# Patient Record
Sex: Female | Born: 1975 | Race: White | Hispanic: No | Marital: Married | Smoking: Former smoker
Health system: Southern US, Community
[De-identification: ages and names within clinical notes are randomized; demographics above are authoritative.]

## PROBLEM LIST (undated history)

## (undated) DIAGNOSIS — E079 Disorder of thyroid, unspecified: Secondary | ICD-10-CM

## (undated) DIAGNOSIS — N870 Mild cervical dysplasia: Secondary | ICD-10-CM

## (undated) DIAGNOSIS — F410 Panic disorder [episodic paroxysmal anxiety] without agoraphobia: Secondary | ICD-10-CM

## (undated) HISTORY — PX: WRIST TENODESIS: SHX1094

## (undated) HISTORY — PX: COLPOSCOPY: SHX161

## (undated) HISTORY — PX: BREAST SURGERY: SHX581

## (undated) HISTORY — PX: CHEST TUBE INSERTION: SHX231

## (undated) HISTORY — PX: GYNECOLOGIC CRYOSURGERY: SHX857

## (undated) HISTORY — PX: OTHER SURGICAL HISTORY: SHX169

## (undated) HISTORY — DX: Mild cervical dysplasia: N87.0

## (undated) HISTORY — DX: Disorder of thyroid, unspecified: E07.9

---

## 1999-04-08 ENCOUNTER — Ambulatory Visit (HOSPITAL_COMMUNITY): Admission: RE | Admit: 1999-04-08 | Discharge: 1999-04-08 | Payer: Self-pay | Admitting: Neurosurgery

## 1999-04-08 ENCOUNTER — Encounter: Payer: Self-pay | Admitting: Neurosurgery

## 2000-12-14 ENCOUNTER — Other Ambulatory Visit: Admission: RE | Admit: 2000-12-14 | Discharge: 2000-12-14 | Payer: Self-pay | Admitting: Plastic Surgery

## 2000-12-14 ENCOUNTER — Encounter (INDEPENDENT_AMBULATORY_CARE_PROVIDER_SITE_OTHER): Payer: Self-pay

## 2001-06-08 ENCOUNTER — Other Ambulatory Visit: Admission: RE | Admit: 2001-06-08 | Discharge: 2001-06-08 | Payer: Self-pay | Admitting: *Deleted

## 2003-08-10 ENCOUNTER — Other Ambulatory Visit: Admission: RE | Admit: 2003-08-10 | Discharge: 2003-08-10 | Payer: Self-pay | Admitting: *Deleted

## 2007-11-11 ENCOUNTER — Other Ambulatory Visit: Admission: RE | Admit: 2007-11-11 | Discharge: 2007-11-11 | Payer: Self-pay | Admitting: Obstetrics and Gynecology

## 2008-12-06 ENCOUNTER — Encounter: Payer: Self-pay | Admitting: Obstetrics and Gynecology

## 2008-12-06 ENCOUNTER — Other Ambulatory Visit: Admission: RE | Admit: 2008-12-06 | Discharge: 2008-12-06 | Payer: Self-pay | Admitting: Obstetrics and Gynecology

## 2008-12-06 ENCOUNTER — Ambulatory Visit: Payer: Self-pay | Admitting: Obstetrics and Gynecology

## 2009-07-19 ENCOUNTER — Emergency Department (HOSPITAL_COMMUNITY): Admission: EM | Admit: 2009-07-19 | Discharge: 2009-07-19 | Payer: Self-pay | Admitting: Emergency Medicine

## 2009-11-19 ENCOUNTER — Encounter: Admission: RE | Admit: 2009-11-19 | Discharge: 2009-11-19 | Payer: Self-pay | Admitting: Family Medicine

## 2009-12-10 ENCOUNTER — Other Ambulatory Visit: Admission: RE | Admit: 2009-12-10 | Discharge: 2009-12-10 | Payer: Self-pay | Admitting: Obstetrics and Gynecology

## 2009-12-10 ENCOUNTER — Ambulatory Visit: Payer: Self-pay | Admitting: Obstetrics and Gynecology

## 2009-12-13 ENCOUNTER — Ambulatory Visit: Payer: Self-pay | Admitting: Obstetrics and Gynecology

## 2010-01-25 ENCOUNTER — Ambulatory Visit: Payer: Self-pay | Admitting: Obstetrics and Gynecology

## 2011-11-26 ENCOUNTER — Encounter: Payer: Self-pay | Admitting: Gynecology

## 2011-11-26 DIAGNOSIS — N870 Mild cervical dysplasia: Secondary | ICD-10-CM | POA: Insufficient documentation

## 2011-11-27 ENCOUNTER — Other Ambulatory Visit (HOSPITAL_COMMUNITY)
Admission: RE | Admit: 2011-11-27 | Discharge: 2011-11-27 | Disposition: A | Discharge status: Home / Self Care | Payer: BC Managed Care – PPO | Source: Ambulatory Visit | Attending: Obstetrics and Gynecology | Admitting: Obstetrics and Gynecology

## 2011-11-27 ENCOUNTER — Ambulatory Visit (INDEPENDENT_AMBULATORY_CARE_PROVIDER_SITE_OTHER): Payer: BC Managed Care – PPO | Admitting: Obstetrics and Gynecology

## 2011-11-27 ENCOUNTER — Encounter: Payer: Self-pay | Admitting: Obstetrics and Gynecology

## 2011-11-27 VITALS — BP 114/70 | Ht 61.0 in | Wt 114.0 lb

## 2011-11-27 DIAGNOSIS — Z01419 Encounter for gynecological examination (general) (routine) without abnormal findings: Secondary | ICD-10-CM

## 2011-11-27 DIAGNOSIS — R350 Frequency of micturition: Secondary | ICD-10-CM

## 2011-11-27 MED ORDER — NITROFURANTOIN MONOHYD MACRO 100 MG PO CAPS: 100.0000 mg | ORAL_CAPSULE | Freq: Two times a day (BID) | ORAL | Status: AC

## 2011-11-27 NOTE — Progress Notes (Signed)
Addended byCammie Mcgee T on: 11/27/2011 03:19 PM   Modules accepted: Orders

## 2011-11-27 NOTE — Progress Notes (Signed)
Patient came to see me today for her annual GYN exam. She continues with a Mirena IUD which she is very happy with. She is amenorrheic. She's noticed for 2 weeks some urinary frequency urgency and dysuria. Urinalysis today was normal. Patient is having no pelvic pain. Patient is done lab work through PCP.  Physical examination:  Kennon Portela present HEENT within normal limits. Neck: Thyroid not large. No masses. Supraclavicular nodes: not enlarged. Breasts: Examined in both sitting midline position. No skin changes and no masses. Abdomen: Soft no guarding rebound or masses or hernia. Pelvic: External: Within normal limits. BUS: Within normal limits. Vaginal:within normal limits. Good estrogen effect. No evidence of cystocele rectocele or enterocele. Cervix: clean, IUD string visible.Uterus: Normal size and shape. Adnexa: No masses. Rectovaginal exam: Confirmatory and negative. Extremities: Within normal limits.  Assessment: #1. Normal GYN exam #2. Probable UTI in spite of negative UA  Plan: Macrobid twice a day with food for 5 days. Urine culture.

## 2013-02-02 ENCOUNTER — Emergency Department (HOSPITAL_COMMUNITY): Payer: BC Managed Care – PPO

## 2013-02-02 ENCOUNTER — Emergency Department (HOSPITAL_COMMUNITY)
Admission: EM | Admit: 2013-02-02 | Discharge: 2013-02-02 | Disposition: A | Discharge status: Home / Self Care | Payer: BC Managed Care – PPO | Attending: Emergency Medicine | Admitting: Emergency Medicine

## 2013-02-02 ENCOUNTER — Encounter (HOSPITAL_COMMUNITY): Payer: Self-pay | Admitting: Emergency Medicine

## 2013-02-02 DIAGNOSIS — N83209 Unspecified ovarian cyst, unspecified side: Secondary | ICD-10-CM | POA: Insufficient documentation

## 2013-02-02 DIAGNOSIS — F410 Panic disorder [episodic paroxysmal anxiety] without agoraphobia: Secondary | ICD-10-CM | POA: Insufficient documentation

## 2013-02-02 DIAGNOSIS — N83201 Unspecified ovarian cyst, right side: Secondary | ICD-10-CM

## 2013-02-02 DIAGNOSIS — R197 Diarrhea, unspecified: Secondary | ICD-10-CM | POA: Insufficient documentation

## 2013-02-02 DIAGNOSIS — K219 Gastro-esophageal reflux disease without esophagitis: Secondary | ICD-10-CM | POA: Insufficient documentation

## 2013-02-02 DIAGNOSIS — Z3202 Encounter for pregnancy test, result negative: Secondary | ICD-10-CM | POA: Insufficient documentation

## 2013-02-02 DIAGNOSIS — Z975 Presence of (intrauterine) contraceptive device: Secondary | ICD-10-CM | POA: Insufficient documentation

## 2013-02-02 DIAGNOSIS — F329 Major depressive disorder, single episode, unspecified: Secondary | ICD-10-CM | POA: Insufficient documentation

## 2013-02-02 DIAGNOSIS — Z87410 Personal history of cervical dysplasia: Secondary | ICD-10-CM | POA: Insufficient documentation

## 2013-02-02 DIAGNOSIS — F411 Generalized anxiety disorder: Secondary | ICD-10-CM | POA: Insufficient documentation

## 2013-02-02 DIAGNOSIS — F3289 Other specified depressive episodes: Secondary | ICD-10-CM | POA: Insufficient documentation

## 2013-02-02 DIAGNOSIS — R110 Nausea: Secondary | ICD-10-CM | POA: Insufficient documentation

## 2013-02-02 DIAGNOSIS — Z79899 Other long term (current) drug therapy: Secondary | ICD-10-CM | POA: Insufficient documentation

## 2013-02-02 HISTORY — DX: Panic disorder (episodic paroxysmal anxiety): F41.0

## 2013-02-02 LAB — POCT I-STAT, CHEM 8
Calcium, Ion: 1.16 mmol/L (ref 1.12–1.23)
Chloride: 105 mEq/L (ref 96–112)
Creatinine, Ser: 0.8 mg/dL (ref 0.50–1.10)
Glucose, Bld: 98 mg/dL (ref 70–99)
HCT: 41 % (ref 36.0–46.0)
Hemoglobin: 13.9 g/dL (ref 12.0–15.0)
Potassium: 3.6 mEq/L (ref 3.5–5.1)

## 2013-02-02 LAB — CBC WITH DIFFERENTIAL/PLATELET
Basophils Absolute: 0 10*3/uL (ref 0.0–0.1)
Basophils Relative: 0 % (ref 0–1)
Eosinophils Relative: 1 % (ref 0–5)
HCT: 38 % (ref 36.0–46.0)
Hemoglobin: 13.3 g/dL (ref 12.0–15.0)
MCH: 31.4 pg (ref 26.0–34.0)
MCHC: 35 g/dL (ref 30.0–36.0)
MCV: 89.6 fL (ref 78.0–100.0)
Monocytes Absolute: 0.8 10*3/uL (ref 0.1–1.0)
Monocytes Relative: 11 % (ref 3–12)
RDW: 12.7 % (ref 11.5–15.5)

## 2013-02-02 LAB — URINALYSIS, ROUTINE W REFLEX MICROSCOPIC
Bilirubin Urine: NEGATIVE
Hgb urine dipstick: NEGATIVE
Ketones, ur: NEGATIVE mg/dL
Nitrite: NEGATIVE
Urobilinogen, UA: 0.2 mg/dL (ref 0.0–1.0)

## 2013-02-02 LAB — WET PREP, GENITAL: Trich, Wet Prep: NONE SEEN

## 2013-02-02 MED ORDER — ONDANSETRON 4 MG PO TBDP: 4.0000 mg | ORAL_TABLET | Freq: Four times a day (QID) | ORAL | Status: DC | PRN

## 2013-02-02 MED ORDER — HYDROMORPHONE HCL PF 1 MG/ML IJ SOLN
1.0000 mg | Freq: Once | INTRAMUSCULAR | Status: AC
  Administered 2013-02-02: 1 mg via INTRAVENOUS
  Filled 2013-02-02: qty 1

## 2013-02-02 MED ORDER — ONDANSETRON HCL 4 MG/2ML IJ SOLN
4.0000 mg | INTRAMUSCULAR | Status: AC
  Administered 2013-02-02: 4 mg via INTRAVENOUS
  Filled 2013-02-02: qty 2

## 2013-02-02 MED ORDER — IOHEXOL 300 MG/ML  SOLN
100.0000 mL | Freq: Once | INTRAMUSCULAR | Status: AC | PRN
  Administered 2013-02-02: 80 mL via INTRAVENOUS

## 2013-02-02 MED ORDER — IOHEXOL 300 MG/ML  SOLN: 25.0000 mL | Freq: Once | INTRAMUSCULAR | Status: DC | PRN

## 2013-02-02 MED ORDER — PERCOCET 5-325 MG PO TABS: 1.0000 | ORAL_TABLET | Freq: Four times a day (QID) | ORAL | Status: DC | PRN

## 2013-02-02 MED ORDER — SODIUM CHLORIDE 0.9 % IV BOLUS (SEPSIS)
1000.0000 mL | Freq: Once | INTRAVENOUS | Status: AC
  Administered 2013-02-02: 1000 mL via INTRAVENOUS

## 2013-02-02 NOTE — ED Provider Notes (Signed)
History     CSN: [id number]  Arrival date & time 02/02/13  1731   First MD Initiated Contact with Patient 02/02/13 1736      Chief Complaint  Patient presents with  . Abdominal Pain  . Diarrhea  . Nausea    (Consider location/radiation/quality/duration/timing/severity/associated sxs/prior treatment) HPI Comments: Patient with PMH of GERD, per patient, presents for abdominal pain x 1 wk that has been gradually worsening over the last two days. Pain is right sided, constant, with intermittent episodes of sharp/stabbing pain. Pain improves when she hold tightly on her stomach. Admits to associated nausea and diarrhea x 2 days. She has had 5 episodes of diarrhea today that have been non-bloody and watery. Has tried taking rolaids for the pain without relief. Last meal was some soup at noon today which she kept down but increased her abdominal discomfort; not associated with fatty meals. Denies fever, lightheadedness, dizziness, CP, SOB, vomiting, dysuria, hematuria, and vaginal discharge. Patient denies recent travel and new medications. Unsure about timing of menstrual cycle; on Mirena which was put in place 12/13/09. Pt is a regular runner for exercise.  Patient is a 37 y.o. female presenting with abdominal pain and diarrhea. The history is provided by the patient. No language interpreter was used.  Abdominal Pain The primary symptoms of the illness include abdominal pain, nausea and diarrhea. The primary symptoms of the illness do not include fever, shortness of breath, vomiting, dysuria, vaginal discharge or vaginal bleeding.  Symptoms associated with the illness do not include chills or hematuria.  Diarrhea The primary symptoms include abdominal pain, nausea and diarrhea. Primary symptoms do not include fever, vomiting, dysuria or rash.  The illness does not include chills.    Past Medical History  Diagnosis Date  . CIN I (cervical intraepithelial neoplasia I)   . Depression   .  Anxiety   . Panic attacks     Past Surgical History  Procedure Date  . Gynecologic cryosurgery   . Breast surgery     Reduction  . Chest tube insertion   . Mirena     Inserted 12-13-09  . Colposcopy   . Wrist tenodesis     Family History  Problem Relation Age of Onset  . Breast cancer Mother   . Colon cancer Maternal Grandmother   . Colon cancer Maternal Grandfather   . Lung cancer Paternal Grandmother   . Prostate cancer Paternal Grandfather     History  Substance Use Topics  . Smoking status: Never Smoker   . Smokeless tobacco: Not on file  . Alcohol Use: Yes     Comment: rare    OB History    Grav Para Term Preterm Abortions TAB SAB Ect Mult Living   0               Review of Systems  Constitutional: Negative for fever and chills.  HENT: Negative for sore throat and trouble swallowing.   Respiratory: Negative for chest tightness and shortness of breath.   Cardiovascular: Negative for chest pain.  Gastrointestinal: Positive for nausea, abdominal pain and diarrhea. Negative for vomiting.  Genitourinary: Negative for dysuria, hematuria, vaginal bleeding, vaginal discharge and difficulty urinating.  Skin: Negative for rash.  Neurological: Negative for dizziness, syncope, light-headedness and headaches.    Allergies  Review of patient's allergies indicates no known allergies.  Home Medications   Current Outpatient Rx  Name  Route  Sig  Dispense  Refill  . ALPRAZOLAM 0.5 MG PO TABS  Oral   Take 0.5 mg by mouth as needed. anxiety         . B COMPLEX PO   Oral   Take by mouth.           Marland Kitchen DIHYDROXYALUMINUM SOD CARB 334 MG PO CHEW   Oral   Chew 1 tablet by mouth as needed. Heartburn         . CALCIUM + D PO   Oral   Take by mouth.           Marland Kitchen VITAMIN D PO   Oral   Take 2,000 Units by mouth.           . ONE-DAILY MULTI VITAMINS PO TABS   Oral   Take 1 tablet by mouth daily.           . SERTRALINE HCL 100 MG PO TABS   Oral   Take  200 mg by mouth daily.          Marland Kitchen VITAMIN B-6 25 MG PO TABS   Oral   Take 25 mg by mouth daily.           BP 117/67  Pulse 99  Temp 97.9 F (36.6 C) (Oral)  Resp 15  SpO2 99%  Physical Exam  Constitutional: She is oriented to person, place, and time.  HENT:  Head: Normocephalic and atraumatic.  Eyes: Conjunctivae normal are normal. Pupils are equal, round, and reactive to light. No scleral icterus.  Neck: Normal range of motion.  Cardiovascular: Normal rate, regular rhythm and normal heart sounds.   No murmur heard. Pulmonary/Chest: Effort normal and breath sounds normal. No respiratory distress. She has no wheezes.  Abdominal: Soft. Bowel sounds are normal. She exhibits no mass. There is tenderness in the right lower quadrant and periumbilical area. There is tenderness at McBurney's point. There is no rigidity and no guarding.  Genitourinary: Vagina normal. There is no rash, tenderness or lesion on the right labia. There is no rash, tenderness or lesion on the left labia. Uterus is not enlarged and not tender. Cervix exhibits no motion tenderness and no discharge. Right adnexum displays no mass and no tenderness. Left adnexum displays no mass and no tenderness. No erythema, tenderness or bleeding around the vagina. No vaginal discharge found.  Musculoskeletal: Normal range of motion.  Neurological: She is alert and oriented to person, place, and time.  Skin: Skin is warm. No rash noted. No erythema. No pallor.  Psychiatric: She has a normal mood and affect. Her behavior is normal.    ED Course  Procedures (including critical care time)  Labs Reviewed  WET PREP, GENITAL - Abnormal; Notable for the following:    Clue Cells Wet Prep HPF POC RARE (*)     WBC, Wet Prep HPF POC FEW (*)     All other components within normal limits  CBC WITH DIFFERENTIAL  URINALYSIS, ROUTINE W REFLEX MICROSCOPIC  POCT I-STAT, CHEM 8  PREGNANCY, URINE  GC/CHLAMYDIA PROBE AMP   No results  found.   No diagnosis found.    MDM  Patient presents for abdominal pain x 1 week that has been progressively worse over the last two days. Associated with nausea, without emesis, and watery non-bloody diarrhea. Patient is tender on palpation primarily in RLQ; some tenderness in RUQ and right flank/lower back. Patient given zofran and dilaudid to help with pain and nausea which she states has significantly improved her symptoms. Patient given IVF as well. Work up  includes UA, chem8, and CBC. Pelvic exam completed to assess for possible ovarian cyst and PID which was benign. Have continued work up with CT abdomen/pelvis with contrast to try and determine etiology of abdominal pain. Will monitor.  Patient signed out to [facility], PA-C at shift change.        Antony Madura, PA-C 02/02/13 2014

## 2013-02-02 NOTE — ED Notes (Signed)
Per EMS pt been having lower abd pain for one week with diarrhea and nausea.  Pt had appt for tomorrow to see PCP about pain but pt was on her way home from work today and had severe pain and experienced a panic attack so called EMS.  18g left AC.

## 2013-02-02 NOTE — ED Provider Notes (Signed)
Patient care resumed from previous provider Antony Madura and Earley Favor.  Patient presented with Abd pain x1 wk grad worsening. RLQ primarily w nausea. Benign pelvic exam. CT abd pending. Labs WNL. IVF, pain and antiemetics given. If normal PCP follow up scheduled tmw here for pain mngt.   CT ABDOMEN AND PELVIS WITH CONTRAST  Technique: Multidetector CT imaging of the abdomen and pelvis was performed following the standard protocol during bolus administration of intravenous contrast. Additional delayed images were also obtained through the pelvis to allow further oral contrast opacification of distal small bowel and cecum.  Contrast: 80mL OMNIPAQUE IOHEXOL 300 MG/ML SOLN  Comparison: None.  Findings: The abdominal parenchymal organs are normal in appearance. Gallbladder is unremarkable. No evidence of hydronephrosis. No soft tissue masses or lymphadenopathy identified within the abdomen or pelvis.  Appendix is not well visualized, but no inflammatory process is seen in the area the cecum were elsewhere within the abdomen or pelvis. An IUD is seen in appropriate position. A small corpus luteum is seen in the right ovary measuring 1.6 cm. No inflammatory process or abnormal fluid collections are identified. No evidence of bowel wall thickening, dilatation, or hernia.  IMPRESSION:  1. 1.6 cm right ovarian corpus luteum cyst. 2. IUD in expected location. 3. No other significant abnormality identified.  10:26 PM  On reevaluation nonsurgical abdomen and patient states she is currently pain-free.  CT results below showing 1.6 cm right ovarian cyst which was discussed with patient.  She is to follow up with her OB/GYN for further evaluation.  Will discharge with pain medication and strict return percussions discussed.  At this time there does not appear to be any evidence of an acute emergency medical condition and the patient appears stable for discharge with appropriate outpatient follow  up. Diagnosis was discussed with patient who verbalizes understanding and is agreeable to discharge.   Jaci Carrel, [place] 02/02/13 2228

## 2013-02-02 NOTE — ED Notes (Signed)
NP at bedside--- pelvic exam ongoing.

## 2013-02-02 NOTE — ED Notes (Signed)
[id number]<IO> Expected date:<BR> Expected time:<BR> Means of arrival:<BR> Comments:<BR> 37 y/o abd pain

## 2013-02-03 LAB — GC/CHLAMYDIA PROBE AMP
CT Probe RNA: NEGATIVE
GC Probe RNA: NEGATIVE

## 2013-02-04 ENCOUNTER — Encounter: Payer: Self-pay | Admitting: Gynecology

## 2013-02-04 ENCOUNTER — Ambulatory Visit (INDEPENDENT_AMBULATORY_CARE_PROVIDER_SITE_OTHER): Payer: BC Managed Care – PPO

## 2013-02-04 ENCOUNTER — Ambulatory Visit (INDEPENDENT_AMBULATORY_CARE_PROVIDER_SITE_OTHER): Payer: BC Managed Care – PPO | Admitting: Gynecology

## 2013-02-04 VITALS — BP 120/78 | Ht 61.0 in | Wt 110.0 lb

## 2013-02-04 DIAGNOSIS — R102 Pelvic and perineal pain: Secondary | ICD-10-CM

## 2013-02-04 DIAGNOSIS — N949 Unspecified condition associated with female genital organs and menstrual cycle: Secondary | ICD-10-CM

## 2013-02-04 DIAGNOSIS — N83201 Unspecified ovarian cyst, right side: Secondary | ICD-10-CM

## 2013-02-04 DIAGNOSIS — Z01419 Encounter for gynecological examination (general) (routine) without abnormal findings: Secondary | ICD-10-CM

## 2013-02-04 DIAGNOSIS — N83209 Unspecified ovarian cyst, unspecified side: Secondary | ICD-10-CM

## 2013-02-04 LAB — CBC WITH DIFFERENTIAL/PLATELET
Basophils Absolute: 0 10*3/uL (ref 0.0–0.1)
Basophils Relative: 0 % (ref 0–1)
HCT: 38.5 % (ref 36.0–46.0)
Lymphocytes Relative: 33 % (ref 12–46)
MCHC: 34.3 g/dL (ref 30.0–36.0)
Neutro Abs: 4.7 10*3/uL (ref 1.7–7.7)
Neutrophils Relative %: 59 % (ref 43–77)
RDW: 14.1 % (ref 11.5–15.5)
WBC: 8 10*3/uL (ref 4.0–10.5)

## 2013-02-04 MED ORDER — IBUPROFEN 800 MG PO TABS: 800.0000 mg | ORAL_TABLET | Freq: Three times a day (TID) | ORAL | Status: DC | PRN

## 2013-02-04 NOTE — Progress Notes (Signed)
Monica Lawson 09/21/1976 [id number]   History:    37 y.o.  for annual gyn exam who was seen in the emergency room this past Wednesday complaining of right lower quadrant pain feeling bloated and full which started a few days prior to that. Patient has a Mirena IUD for contraception that was placed in 2010 and she rarely has a menstrual cycle. She denies any recent dyspareunia and is in a monogamous relationship. She denies any GU or GI complaints. She had a CT scan in the emergency room with the following result:  Findings: The abdominal parenchymal organs are normal in  appearance. Gallbladder is unremarkable. No evidence of  hydronephrosis. No soft tissue masses or lymphadenopathy  identified within the abdomen or pelvis.  Appendix is not well visualized, but no inflammatory process is  seen in the area the cecum were elsewhere within the abdomen or  pelvis. An IUD is seen in appropriate position. A small corpus  luteum is seen in the right ovary measuring 1.6 cm. No  inflammatory process or abnormal fluid collections are identified.  No evidence of bowel wall thickening, dilatation, or hernia.  IMPRESSION:  1. 1.6 cm right ovarian corpus luteum cyst.  2. IUD in expected location.  3. No other significant abnormality identified.  Patient had a normal CBC and electrolytes as well as a blood sugar and and negative pregnancy test. Patient was given Percocet for pain and instructed to followup which she did today.  She was due for her annual exam. She frequently does her self breast examination. Her last Pap smear was normal in 2012. Patient with past history of CIN-1 treated with cryo-over 12 years ago. Her followup Pap smears have been normal.     Past medical history,surgical history, family history and social history were all reviewed and documented in the EPIC chart.  Gynecologic History No LMP recorded. Patient is not currently having periods (Reason: IUD). Contraception: IUD Last Pap:  2012. Results were: normal Last mammogram: Not indicated. Results were: Not indicated  Obstetric History OB History    Grav Para Term Preterm Abortions TAB SAB Ect Mult Living   0                ROS: A ROS was performed and pertinent positives and negatives are included in the history.  GENERAL: No fevers or chills. HEENT: No change in vision, no earache, sore throat or sinus congestion. NECK: No pain or stiffness. CARDIOVASCULAR: No chest pain or pressure. No palpitations. PULMONARY: No shortness of breath, cough or wheeze. GASTROINTESTINAL: No abdominal pain, nausea, vomiting or diarrhea, melena or bright red blood per rectum. GENITOURINARY: No urinary frequency, urgency, hesitancy or dysuria. MUSCULOSKELETAL: No joint or muscle pain, no back pain, no recent trauma. DERMATOLOGIC: No rash, no itching, no lesions. ENDOCRINE: No polyuria, polydipsia, no heat or cold intolerance. No recent change in weight. HEMATOLOGICAL: No anemia or easy bruising or bleeding. NEUROLOGIC: No headache, seizures, numbness, tingling or weakness. PSYCHIATRIC: No depression, no loss of interest in normal activity or change in sleep pattern.     Exam: chaperone present  BP 120/78  Ht 5\' 1"  (1.549 m)  Wt 110 lb (49.896 kg)  BMI 20.78 kg/m2  Body mass index is 20.78 kg/(m^2).  General appearance : Well developed well nourished female. No acute distress HEENT: Neck supple, trachea midline, no carotid bruits, no thyroidmegaly Lungs: Clear to auscultation, no rhonchi or wheezes, or rib retractions  Heart: Regular rate and rhythm, no murmurs or gallops Breast:Examined  in sitting and supine position were symmetrical in appearance, no palpable masses or tenderness,  no skin retraction, no nipple inversion, no nipple discharge, no skin discoloration, no axillary or supraclavicular lymphadenopathy Abdomen: no palpable masses some tenderness but no rebound or guarding of the left lower quadrant Extremities: no edema or  skin discoloration or tenderness  Pelvic:  Bartholin, Urethra, Skene Glands: Within normal limits             Vagina: No gross lesions or discharge  Cervix: No gross lesions or discharge, IUD string seen  Uterus  no lesions or discharge, normal size, shape and consistency, non-tender and mobile  Adnexa  tenderness noted on the left adnexa but no rebound or guarding or mass effect  Anus and perineum  normal   Rectovaginal  normal sphincter tone without palpated masses or tenderness             Hemoccult not indicated   Ultrasound was done in our office today with the following: Uterus measures 6.7 x 4.0 x 3.2 cm with endometrial stripe of 3.6 mm IUD seen in the endometrial cavity. Left ovary normal. A right ovarian echo-free thin wall avascular cyst measuring 2.0 x 1.7 x 1.8 cm suspicious is corpus luteum cyst.  Assessment/Plan:  37 y.o. female for annual exam with recent episode of right lower quadrant discomfort probably attributed to a ruptured cyst although no free fluid was noted in the cul-de-sac today only what appears to be the remnant of a corpus luteum cyst. Patient will continue her Percocet when necessary that was previously prescribed. I will prescribe her Motrin 800 mg she can take in between every 8 hours as needed. No Pap smear done today Last discussed. All her labs were drawn recently so no additional blood work was drawn today. She was reminded to do her monthly self breast examination. We'll see her back in one year or when necessary.    Ok Edwards MD, 5:53 PM 02/04/2013

## 2013-02-05 LAB — URINALYSIS W MICROSCOPIC + REFLEX CULTURE
Crystals: NONE SEEN
Leukocytes, UA: NEGATIVE
Nitrite: NEGATIVE
Protein, ur: NEGATIVE mg/dL
Specific Gravity, Urine: 1.012 (ref 1.005–1.030)
Squamous Epithelial / LPF: NONE SEEN
Urobilinogen, UA: 0.2 mg/dL (ref 0.0–1.0)

## 2013-02-06 NOTE — ED Provider Notes (Signed)
Medical screening examination/treatment/procedure(s) were performed by non-physician practitioner and as supervising physician I was immediately available for consultation/collaboration.  Raeford Razor, MD 02/06/13 [phone]

## 2013-02-06 NOTE — ED Provider Notes (Signed)
Medical screening examination/treatment/procedure(s) were performed by non-physician practitioner and as supervising physician I was immediately available for consultation/collaboration.  Raeford Razor, MD 02/06/13 0120

## 2013-11-22 ENCOUNTER — Telehealth: Payer: Self-pay | Admitting: *Deleted

## 2013-11-22 NOTE — Telephone Encounter (Signed)
Pt called requesting placement date for mirena IUD, pt informed placed in 2010.

## 2014-02-08 ENCOUNTER — Other Ambulatory Visit (HOSPITAL_COMMUNITY)
Admission: RE | Admit: 2014-02-08 | Discharge: 2014-02-08 | Disposition: A | Discharge status: Home / Self Care | Payer: BC Managed Care – PPO | Source: Ambulatory Visit | Attending: Gynecology | Admitting: Gynecology

## 2014-02-08 ENCOUNTER — Ambulatory Visit (INDEPENDENT_AMBULATORY_CARE_PROVIDER_SITE_OTHER): Payer: BC Managed Care – PPO | Admitting: Women's Health

## 2014-02-08 ENCOUNTER — Encounter: Payer: Self-pay | Admitting: Women's Health

## 2014-02-08 VITALS — BP 120/78 | Ht 62.0 in | Wt 114.0 lb

## 2014-02-08 DIAGNOSIS — F419 Anxiety disorder, unspecified: Secondary | ICD-10-CM

## 2014-02-08 DIAGNOSIS — Z01419 Encounter for gynecological examination (general) (routine) without abnormal findings: Secondary | ICD-10-CM | POA: Insufficient documentation

## 2014-02-08 DIAGNOSIS — N949 Unspecified condition associated with female genital organs and menstrual cycle: Secondary | ICD-10-CM

## 2014-02-08 DIAGNOSIS — F341 Dysthymic disorder: Secondary | ICD-10-CM

## 2014-02-08 DIAGNOSIS — R102 Pelvic and perineal pain: Secondary | ICD-10-CM

## 2014-02-08 DIAGNOSIS — F32A Depression, unspecified: Secondary | ICD-10-CM | POA: Insufficient documentation

## 2014-02-08 DIAGNOSIS — F329 Major depressive disorder, single episode, unspecified: Secondary | ICD-10-CM

## 2014-02-08 NOTE — Patient Instructions (Signed)
BRCA testing Health Maintenance, Female A healthy lifestyle and preventative care can promote health and wellness.  Maintain regular health, dental, and eye exams.  Eat a healthy diet. Foods like vegetables, fruits, whole grains, low-fat dairy products, and lean protein foods contain the nutrients you need without too many calories. Decrease your intake of foods high in solid fats, added sugars, and salt. Get information about a proper diet from your caregiver, if necessary.  Regular physical exercise is one of the most important things you can do for your health. Most adults should get at least 150 minutes of moderate-intensity exercise (any activity that increases your heart rate and causes you to sweat) each week. In addition, most adults need muscle-strengthening exercises on 2 or more days a week.   Maintain a healthy weight. The body mass index (BMI) is a screening tool to identify possible weight problems. It provides an estimate of body fat based on height and weight. Your caregiver can help determine your BMI, and can help you achieve or maintain a healthy weight. For adults 20 years and older:  A BMI below 18.5 is considered underweight.  A BMI of 18.5 to 24.9 is normal.  A BMI of 25 to 29.9 is considered overweight.  A BMI of 30 and above is considered obese.  Maintain normal blood lipids and cholesterol by exercising and minimizing your intake of saturated fat. Eat a balanced diet with plenty of fruits and vegetables. Blood tests for lipids and cholesterol should begin at age 24 and be repeated every 5 years. If your lipid or cholesterol levels are high, you are over 50, or you are a high risk for heart disease, you may need your cholesterol levels checked more frequently.Ongoing high lipid and cholesterol levels should be treated with medicines if diet and exercise are not effective.  If you smoke, find out from your caregiver how to quit. If you do not use tobacco, do not  start.  Lung cancer screening is recommended for adults aged 26 80 years who are at high risk for developing lung cancer because of a history of smoking. Yearly low-dose computed tomography (CT) is recommended for people who have at least a 30-pack-year history of smoking and are a current smoker or have quit within the past 15 years. A pack year of smoking is smoking an average of 1 pack of cigarettes a day for 1 year (for example: 1 pack a day for 30 years or 2 packs a day for 15 years). Yearly screening should continue until the smoker has stopped smoking for at least 15 years. Yearly screening should also be stopped for people who develop a health problem that would prevent them from having lung cancer treatment.  If you are pregnant, do not drink alcohol. If you are breastfeeding, be very cautious about drinking alcohol. If you are not pregnant and choose to drink alcohol, do not exceed 1 drink per day. One drink is considered to be 12 ounces (355 mL) of beer, 5 ounces (148 mL) of wine, or 1.5 ounces (44 mL) of liquor.  Avoid use of street drugs. Do not share needles with anyone. Ask for help if you need support or instructions about stopping the use of drugs.  High blood pressure causes heart disease and increases the risk of stroke. Blood pressure should be checked at least every 1 to 2 years. Ongoing high blood pressure should be treated with medicines, if weight loss and exercise are not effective.  If you are  70 to 36 years old, ask your caregiver if you should take aspirin to prevent strokes.  Diabetes screening involves taking a blood sample to check your fasting blood sugar level. This should be done once every 3 years, after age 48, if you are within normal weight and without risk factors for diabetes. Testing should be considered at a younger age or be carried out more frequently if you are overweight and have at least 1 risk factor for diabetes.  Breast cancer screening is essential  preventative care for women. You should practice "breast self-awareness." This means understanding the normal appearance and feel of your breasts and may include breast self-examination. Any changes detected, no matter how small, should be reported to a caregiver. Women in their 27s and 30s should have a clinical breast exam (CBE) by a caregiver as part of a regular health exam every 1 to 3 years. After age 58, women should have a CBE every year. Starting at age 38, women should consider having a mammogram (breast X-ray) every year. Women who have a family history of breast cancer should talk to their caregiver about genetic screening. Women at a high risk of breast cancer should talk to their caregiver about having an MRI and a mammogram every year.  Breast cancer gene (BRCA)-related cancer risk assessment is recommended for women who have family members with BRCA-related cancers. BRCA-related cancers include breast, ovarian, tubal, and peritoneal cancers. Having family members with these cancers may be associated with an increased risk for harmful changes (mutations) in the breast cancer genes BRCA1 and BRCA2. Results of the assessment will determine the need for genetic counseling and BRCA1 and BRCA2 testing.  The Pap test is a screening test for cervical cancer. Women should have a Pap test starting at age 63. Between ages 100 and 90, Pap tests should be repeated every 2 years. Beginning at age 41, you should have a Pap test every 3 years as long as the past 3 Pap tests have been normal. If you had a hysterectomy for a problem that was not cancer or a condition that could lead to cancer, then you no longer need Pap tests. If you are between ages 42 and 37, and you have had normal Pap tests going back 10 years, you no longer need Pap tests. If you have had past treatment for cervical cancer or a condition that could lead to cancer, you need Pap tests and screening for cancer for at least 20 years after your  treatment. If Pap tests have been discontinued, risk factors (such as a new sexual partner) need to be reassessed to determine if screening should be resumed. Some women have medical problems that increase the chance of getting cervical cancer. In these cases, your caregiver may recommend more frequent screening and Pap tests.  The human papillomavirus (HPV) test is an additional test that may be used for cervical cancer screening. The HPV test looks for the virus that can cause the cell changes on the cervix. The cells collected during the Pap test can be tested for HPV. The HPV test could be used to screen women aged 73 years and older, and should be used in women of any age who have unclear Pap test results. After the age of 68, women should have HPV testing at the same frequency as a Pap test.  Colorectal cancer can be detected and often prevented. Most routine colorectal cancer screening begins at the age of 38 and continues through age 51. However,  your caregiver may recommend screening at an earlier age if you have risk factors for colon cancer. On a yearly basis, your caregiver may provide home test kits to check for hidden blood in the stool. Use of a small camera at the end of a tube, to directly examine the colon (sigmoidoscopy or colonoscopy), can detect the earliest forms of colorectal cancer. Talk to your caregiver about this at age 29, when routine screening begins. Direct examination of the colon should be repeated every 5 to 10 years through age 83, unless early forms of pre-cancerous polyps or small growths are found.  Hepatitis C blood testing is recommended for all people born from [birth year] through 1963 and any individual with known risks for hepatitis C.  Practice safe sex. Use condoms and avoid high-risk sexual practices to reduce the spread of sexually transmitted infections (STIs). Sexually active women aged 31 and younger should be checked for Chlamydia, which is a common sexually  transmitted infection. Older women with new or multiple partners should also be tested for Chlamydia. Testing for other STIs is recommended if you are sexually active and at increased risk.  Osteoporosis is a disease in which the bones lose minerals and strength with aging. This can result in serious bone fractures. The risk of osteoporosis can be identified using a bone density scan. Women ages 39 and over and women at risk for fractures or osteoporosis should discuss screening with their caregivers. Ask your caregiver whether you should be taking a calcium supplement or vitamin D to reduce the rate of osteoporosis.  Menopause can be associated with physical symptoms and risks. Hormone replacement therapy is available to decrease symptoms and risks. You should talk to your caregiver about whether hormone replacement therapy is right for you.  Use sunscreen. Apply sunscreen liberally and repeatedly throughout the day. You should seek shade when your shadow is shorter than you. Protect yourself by wearing long sleeves, pants, a wide-brimmed hat, and sunglasses year round, whenever you are outdoors.  Notify your caregiver of new moles or changes in moles, especially if there is a change in shape or color. Also notify your caregiver if a mole is larger than the size of a pencil eraser.  Stay current with your immunizations. Document Released: 06/30/2011 Document Revised: 04/11/2013 Document Reviewed: 06/30/2011 [facility] Patient Information 2014 [place].

## 2014-02-08 NOTE — Progress Notes (Signed)
Monica Lawson 12-Jul-1976 [id number]    History:    Presents for annual exam.  Mirena IUD with rare bleeding placed 11/2009. 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 Mother breast cancer at menopause late 38s. Anxiety/depression and panic attacks on antidepressants per psychiatrist.  Past medical history, past surgical history, family history and social history were all reviewed and documented in the EPIC chart. Assistant to Music therapist. 3 grandparents history of colon cancer.  ROS:  A  ROS was performed and pertinent positives and negatives are included.  Exam:  Filed Vitals:   02/08/14 1523  BP: 120/78    General appearance:  Normal Thyroid:  Symmetrical, normal in size, without palpable masses or nodularity. Respiratory  Auscultation:  Clear without wheezing or rhonchi Cardiovascular  Auscultation:  Regular rate, without rubs, murmurs or gallops  Edema/varicosities:  Not grossly evident Abdominal  Soft,nontender, without masses, guarding or rebound.  Liver/spleen:  No organomegaly noted  Hernia:  None appreciated  Skin  Inspection:  Grossly normal   Breasts: Examined lying and sitting.     Right: Without masses, retractions, discharge or axillary adenopathy.     Left: Without masses, retractions, discharge or axillary adenopathy. Gentitourinary   Inguinal/mons:  Normal without inguinal adenopathy  External genitalia:  Normal  BUS/Urethra/Skene's glands:  Normal  Vagina:  Normal  Cervix:  Normal IUD strings visible  Uterus:   normal in size, shape and contour.  Midline and mobile  Adnexa/parametria:     Rt: Without masses or tenderness.   Lt: Without masses or tenderness.  Anus and perineum: Normal  Digital rectal exam: Normal sphincter tone without palpated masses or tenderness  Assessment/Plan:  38 y.o.MWF G0  for annual exam with complaint of right lower quadrant pain  with bloating for months.  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 SBE's, screening at annual mammogram, will schedule. Continue regular exercise, works with a trainer, calcium rich diet, MVI daily encouraged. Is not planning on children. Labs at primary care. Pap, Pap normal 10/2011, new screening guidelines reviewed. Return to office in December for Dr. Phineas Real to remove and replaced Mirena IUD. Schedule ultrasound to rule out GYN problem, if normal referred to GI for evaluation.   [name], 5:09 PM 02/08/2014

## 2014-02-10 ENCOUNTER — Ambulatory Visit (INDEPENDENT_AMBULATORY_CARE_PROVIDER_SITE_OTHER): Payer: BC Managed Care – PPO | Admitting: Women's Health

## 2014-02-10 ENCOUNTER — Encounter: Payer: Self-pay | Admitting: Women's Health

## 2014-02-10 ENCOUNTER — Other Ambulatory Visit: Payer: Self-pay

## 2014-02-10 ENCOUNTER — Ambulatory Visit (INDEPENDENT_AMBULATORY_CARE_PROVIDER_SITE_OTHER): Payer: BC Managed Care – PPO

## 2014-02-10 ENCOUNTER — Other Ambulatory Visit: Payer: Self-pay | Admitting: Women's Health

## 2014-02-10 DIAGNOSIS — Z30431 Encounter for routine checking of intrauterine contraceptive device: Secondary | ICD-10-CM

## 2014-02-10 DIAGNOSIS — Z975 Presence of (intrauterine) contraceptive device: Secondary | ICD-10-CM

## 2014-02-10 DIAGNOSIS — Z803 Family history of malignant neoplasm of breast: Secondary | ICD-10-CM

## 2014-02-10 DIAGNOSIS — R102 Pelvic and perineal pain: Secondary | ICD-10-CM

## 2014-02-10 DIAGNOSIS — Z1231 Encounter for screening mammogram for malignant neoplasm of breast: Secondary | ICD-10-CM

## 2014-02-10 DIAGNOSIS — N949 Unspecified condition associated with female genital organs and menstrual cycle: Secondary | ICD-10-CM

## 2014-02-10 NOTE — Progress Notes (Signed)
Patient ID: Monica Lawson, female   DOB: [birth year], 38 y.o.   MRN: [id number] Presents for ultrasound has had persistent right lower quadrant pain with bloating, loose stools, questionable colon spasms. Contraceptives with IUD. Denies fever, urinary symptoms or vaginal discharge.  Ultrasound: No uterine abnormalities noted. IUD seen in endometrial cavity. Ovaries appear normal. No free fluid noted.  Right lower quadrant pain/bloating  Plan: Reviewed normality of GYN ultrasound. Instructed to schedule appointment with GI Dr. Sherrin Daisy to call if unable to obtain appointment with Dr. Earlean Shawl, (her Dr. of choice.)

## 2014-02-15 ENCOUNTER — Telehealth: Payer: Self-pay | Admitting: *Deleted

## 2014-02-15 NOTE — Telephone Encounter (Signed)
Pt needs referral to Dr.Medoff office per Amy notes will be faxed and Dr.Medoff will review and they will contact pt to schedule.

## 2014-02-27 ENCOUNTER — Ambulatory Visit
Admission: RE | Admit: 2014-02-27 | Discharge: 2014-02-27 | Disposition: A | Discharge status: Home / Self Care | Payer: BC Managed Care – PPO | Source: Ambulatory Visit

## 2014-02-27 DIAGNOSIS — Z803 Family history of malignant neoplasm of breast: Secondary | ICD-10-CM

## 2014-02-27 DIAGNOSIS — Z1231 Encounter for screening mammogram for malignant neoplasm of breast: Secondary | ICD-10-CM

## 2014-02-27 NOTE — Telephone Encounter (Signed)
I called and left message at [facility] office to see if patient has been schedule

## 2014-03-01 NOTE — Telephone Encounter (Signed)
Spoke with Dr.Medoff nurse and he will be out of the office until June. He would recommend patient to see Dr.Hung. Pt was okay with this and is scheduled on 03/08/14 @ 3:30 pm , notes faxed.

## 2014-03-23 ENCOUNTER — Other Ambulatory Visit: Payer: Self-pay | Admitting: Gastroenterology

## 2014-03-23 DIAGNOSIS — R1013 Epigastric pain: Secondary | ICD-10-CM

## 2014-03-31 ENCOUNTER — Encounter (HOSPITAL_COMMUNITY)
Admission: RE | Admit: 2014-03-31 | Discharge: 2014-03-31 | Disposition: A | Discharge status: Home / Self Care | Payer: BC Managed Care – PPO | Source: Ambulatory Visit | Attending: Gastroenterology | Admitting: Gastroenterology

## 2014-03-31 DIAGNOSIS — R1013 Epigastric pain: Secondary | ICD-10-CM | POA: Insufficient documentation

## 2014-03-31 MED ORDER — TECHNETIUM TC 99M MEBROFENIN IV KIT: 5.0000 | PACK | Freq: Once | INTRAVENOUS | Status: AC | PRN

## 2014-03-31 MED ORDER — SINCALIDE 5 MCG IJ SOLR
0.0200 ug/kg | Freq: Once | INTRAMUSCULAR | Status: AC
  Administered 2014-03-31: 1 ug via INTRAVENOUS

## 2014-03-31 MED ORDER — SINCALIDE 5 MCG IJ SOLR
INTRAMUSCULAR | Status: AC
  Administered 2014-03-31: 1 ug via INTRAVENOUS
  Filled 2014-03-31: qty 5

## 2014-04-13 ENCOUNTER — Encounter (HOSPITAL_COMMUNITY): Payer: Self-pay

## 2014-04-13 ENCOUNTER — Encounter (HOSPITAL_COMMUNITY)
Admission: RE | Admit: 2014-04-13 | Discharge: 2014-04-13 | Disposition: A | Discharge status: Home / Self Care | Payer: BC Managed Care – PPO | Source: Ambulatory Visit | Attending: General Surgery | Admitting: General Surgery

## 2014-04-13 DIAGNOSIS — Z01812 Encounter for preprocedural laboratory examination: Secondary | ICD-10-CM | POA: Insufficient documentation

## 2014-04-13 LAB — BASIC METABOLIC PANEL
BUN: 15 mg/dL (ref 6–23)
CHLORIDE: 102 meq/L (ref 96–112)
CO2: 26 meq/L (ref 19–32)
Calcium: 9.6 mg/dL (ref 8.4–10.5)
Creatinine, Ser: 0.66 mg/dL (ref 0.50–1.10)
GFR calc non Af Amer: >90 mL/min (ref >90)
Glucose, Bld: 95 mg/dL (ref 70–99)
POTASSIUM: 4.4 meq/L (ref 3.7–5.3)
Sodium: 140 mEq/L (ref 137–147)

## 2014-04-13 LAB — CBC WITH DIFFERENTIAL/PLATELET
Basophils Absolute: 0 10*3/uL (ref 0.0–0.1)
Basophils Relative: 1 % (ref 0–1)
EOS PCT: 1 % (ref 0–5)
Eosinophils Absolute: 0.1 10*3/uL (ref 0.0–0.7)
HCT: 39.9 % (ref 36.0–46.0)
HEMOGLOBIN: 13.7 g/dL (ref 12.0–15.0)
LYMPHS ABS: 2 10*3/uL (ref 0.7–4.0)
Lymphocytes Relative: 31 % (ref 12–46)
MCH: 31.6 pg (ref 26.0–34.0)
MCHC: 34.3 g/dL (ref 30.0–36.0)
MCV: 91.9 fL (ref 78.0–100.0)
MONO ABS: 0.6 10*3/uL (ref 0.1–1.0)
Monocytes Relative: 10 % (ref 3–12)
NEUTROS ABS: 3.7 10*3/uL (ref 1.7–7.7)
Neutrophils Relative %: 57 % (ref 43–77)
Platelets: 286 10*3/uL (ref 150–400)
RBC: 4.34 MIL/uL (ref 3.87–5.11)
RDW: 12.6 % (ref 11.5–15.5)
WBC: 6.4 10*3/uL (ref 4.0–10.5)

## 2014-04-13 LAB — HEPATIC FUNCTION PANEL
ALT: 12 U/L (ref 0–35)
AST: 22 U/L (ref 0–37)
Albumin: 4.6 g/dL (ref 3.5–5.2)
Alkaline Phosphatase: 74 U/L (ref 39–117)
Bilirubin, Direct: <0.2 mg/dL (ref 0.0–0.3)
TOTAL PROTEIN: 7.5 g/dL (ref 6.0–8.3)
Total Bilirubin: 0.3 mg/dL (ref 0.3–1.2)

## 2014-04-13 LAB — HCG, SERUM, QUALITATIVE: PREG SERUM: NEGATIVE

## 2014-04-13 NOTE — H&P (Signed)
  NTS SOAP Note  Vital Signs:  Vitals as of: 0/62/3762: Systolic 831: Diastolic 70: Heart Rate 67: Temp 98.15F: Height 90ft 1in: Weight 113Lbs 0 Ounces: Pain Level 3: BMI 21.35  BMI : 21.35 kg/m2  Subjective: This 38 Years 56 Months old Female presents for of right upper quadrant abdominal pain.  Has been occurring for many months now.  Made worse with fatty food.  No fever, chills, jaundice.  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 Being followed by GI in [place].  Review of Symptoms:  Constitutional:unremarkable   Head:unremarkable    Eyes:unremarkable   Nose/Mouth/Throat:unremarkable Cardiovascular:  unremarkable   Respiratory:unremarkable   Gastrointestin    abdominal pain,nausea,heartburn Genitourinary:unremarkable       joint, neck pain Skin:unremarkable Hematolgic/Lymphatic:unremarkable     Allergic/Immunologic:unremarkable     Past Medical History:    Reviewed  Past Medical History  Surgical History: chest tube, breast reduction, wrist surgery Medical Problems: anxiety Allergies: nkda Medications: zoloft, alprazolam, align, oxycodone   Social History:Reviewed  Social History  Preferred Language: English Race:  White Ethnicity: Not Hispanic / Latino Age: 38 Years 7 Months Marital Status:  M Alcohol: occassionally Recreational drug(s): no   Smoking Status: Never smoker reviewed on 04/13/2014 Functional Status reviewed on 04/13/2014 ------------------------------------------------ Bathing: Normal Cooking: Normal Dressing: Normal Driving: Normal Eating: Normal Managing Meds: Normal Oral Care: Normal Shopping: Normal Toileting: Normal Transferring: Normal Walking: Normal Cognitive Status reviewed on 04/13/2014 ------------------------------------------------ Attention: Normal Decision Making: Normal Language:  Normal Memory: Normal Motor: Normal Perception: Normal Problem Solving: Normal Visual and Spatial: Normal   Family History:  Reviewed  Family Health History Mother, Living; Breast cancer;  Father, Living; Healthy;     Objective Information: General:  Well appearing, well nourished in no distress.   no scleral icterus Heart:  RRR, no murmur Lungs:    CTA bilaterally, no wheezes, rhonchi, rales.  Breathing unlabored. Abdomen:Soft, tender in right upper quadrant to palpation, ND, normal bowel sounds, no HSM, no masses.  No peritoneal signs.  Assessment:Chronic cholecystitis  Diagnoses: 575.11 Chronic cholecystitis (Chronic cholecystitis)  Procedures: 51761 - OFFICE OUTPATIENT NEW 30 MINUTES    Plan:  Scheduled for laparoscopic cholecystectomy on 04/19/14.   Patient Education:Alternative treatments to surgery were discussed with patient (and family).  Risks and benefits  of procedure including bleeding, infection, hepatobiliary injury, and the possibility of an open procedure were fully explained to the patient (and family) who gave informed consent. Patient/family questions were addressed.  Follow-up:Pending Surgery

## 2014-04-13 NOTE — Patient Instructions (Signed)
Monica Lawson  04/13/2014   Your procedure is scheduled on:  04/19/2014  Report to [facility] at  1100 AM.  Call this number if you have problems the morning of surgery: [phone]   Remember:   Do not eat food or drink liquids after midnight.   Take these medicines the morning of surgery with A SIP OF WATER:  Xanax, zoloft   Do not wear jewelry, make-up or nail polish.  Do not wear lotions, powders, or perfumes.   Do not shave 48 hours prior to surgery. Men may shave face and neck.  Do not bring valuables to the hospital.  [facility] is not responsible for any belongings or valuables.               Contacts, dentures or bridgework may not be worn into surgery.  Leave suitcase in the car. After surgery it may be brought to your room.  For patients admitted to the hospital, discharge time is determined by your treatment team.               Patients discharged the day of surgery will not be allowed to drive home.  Name and phone number of your driver: family  Special Instructions: Shower using CHG 2 nights before surgery and the night before surgery.  If you shower the day of surgery use CHG.  Use special wash - you have one bottle of CHG for all showers.  You should use approximately 1/3 of the bottle for each shower.   Please read over the following fact sheets that you were given: Pain Booklet, Coughing and Deep Breathing, Surgical Site Infection Prevention, Anesthesia Post-op Instructions and Care and Recovery After Surgery Laparoscopic Cholecystectomy Laparoscopic cholecystectomy is surgery to remove the gallbladder. The gallbladder is located in the upper right part of the abdomen, behind the liver. It is a storage sac for bile produced in the liver. Bile aids in the digestion and absorption of fats. Cholecystectomy is often done for inflammation of the gallbladder (cholecystitis). This condition is usually caused by a buildup of gallstones (cholelithiasis) in your gallbladder.  Gallstones can block the flow of bile, resulting in inflammation and pain. In severe cases, emergency surgery may be required. When emergency surgery is not required, you will have time to prepare for the procedure. Laparoscopic surgery is an alternative to open surgery. Laparoscopic surgery has a shorter recovery time. Your common bile duct may also need to be examined during the procedure. If stones are found in the common bile duct, they may be removed. LET [facility] CARE PROVIDER KNOW ABOUT:  Any allergies you have.  All medicines you are taking, including vitamins, herbs, eye drops, creams, and over-the-counter medicines.  Previous problems you or members of your family have had with the use of anesthetics.  Any blood disorders you have.  Previous surgeries you have had.  Medical conditions you have. RISKS AND COMPLICATIONS Generally, this is a safe procedure. However, as with any procedure, complications can occur. Possible complications include:  Infection.  Damage to the common bile duct, nerves, arteries, veins, or other internal organs such as the stomach, liver, or intestines.  Bleeding.  A stone may remain in the common bile duct.  A bile leak from the cyst duct that is clipped when your gallbladder is removed.  The need to convert to open surgery, which requires a larger incision in the abdomen. This may be necessary if your surgeon thinks it is  not safe to continue with a laparoscopic procedure. BEFORE THE PROCEDURE  Ask your health care provider about changing or stopping any regular medicines. You will need to stop taking aspirin or blood thinners at least 5 days prior to surgery.  Do not eat or drink anything after midnight the night before surgery.  Let your health care provider know if you develop a cold or other infectious problem before surgery. PROCEDURE   You will be given medicine to make you sleep through the procedure (general anesthetic). A breathing  tube will be placed in your mouth.  When you are asleep, your surgeon will make several small cuts (incisions) in your abdomen.  A thin, lighted tube with a tiny camera on the end (laparoscope) is inserted through one of the small incisions. The camera on the laparoscope sends a picture to a TV screen in the operating room. This gives the surgeon a good view inside your abdomen.  A gas will be pumped into your abdomen. This expands your abdomen so that the surgeon has more room to perform the surgery.  Other tools needed for the procedure are inserted through the other incisions. The gallbladder is removed through one of the incisions.  After the removal of your gallbladder, the incisions will be closed with stitches, staples, or skin glue. AFTER THE PROCEDURE  You will be taken to a recovery area where your progress will be checked often.  You may be allowed to go home the same day if your pain is controlled and you can tolerate liquids. Document Released: 12/15/2005 Document Revised: 10/05/2013 Document Reviewed: 07/27/2013 [facility] Patient Information 2014 [place]. PATIENT INSTRUCTIONS POST-ANESTHESIA  IMMEDIATELY FOLLOWING SURGERY:  Do not drive or operate machinery for the first twenty four hours after surgery.  Do not make any important decisions for twenty four hours after surgery or while taking narcotic pain medications or sedatives.  If you develop intractable nausea and vomiting or a severe headache please notify your doctor immediately.  FOLLOW-UP:  Please make an appointment with your surgeon as instructed. You do not need to follow up with anesthesia unless specifically instructed to do so.  WOUND CARE INSTRUCTIONS (if applicable):  Keep a dry clean dressing on the anesthesia/puncture wound site if there is drainage.  Once the wound has quit draining you may leave it open to air.  Generally you should leave the bandage intact for twenty four hours unless there is  drainage.  If the epidural site drains for more than 36-48 hours please call the anesthesia department.  QUESTIONS?:  Please feel free to call your physician or the hospital operator if you have any questions, and they will be happy to assist you.

## 2014-04-13 NOTE — Pre-Procedure Instructions (Signed)
Patient given information to sign up for my chart at home. 

## 2014-04-19 ENCOUNTER — Encounter (HOSPITAL_COMMUNITY)
Admission: RE | Disposition: A | Discharge status: Home / Self Care | Payer: Self-pay | Source: Ambulatory Visit | Attending: General Surgery

## 2014-04-19 ENCOUNTER — Encounter (HOSPITAL_COMMUNITY): Payer: BC Managed Care – PPO | Admitting: Anesthesiology

## 2014-04-19 ENCOUNTER — Ambulatory Visit (HOSPITAL_COMMUNITY): Payer: BC Managed Care – PPO | Admitting: Anesthesiology

## 2014-04-19 ENCOUNTER — Encounter (HOSPITAL_COMMUNITY): Payer: Self-pay | Admitting: *Deleted

## 2014-04-19 ENCOUNTER — Ambulatory Visit (HOSPITAL_COMMUNITY)
Admission: RE | Admit: 2014-04-19 | Discharge: 2014-04-19 | Disposition: A | Discharge status: Home / Self Care | Payer: BC Managed Care – PPO | Source: Ambulatory Visit | Attending: General Surgery | Admitting: General Surgery

## 2014-04-19 DIAGNOSIS — Z79899 Other long term (current) drug therapy: Secondary | ICD-10-CM | POA: Insufficient documentation

## 2014-04-19 DIAGNOSIS — K811 Chronic cholecystitis: Secondary | ICD-10-CM | POA: Insufficient documentation

## 2014-04-19 DIAGNOSIS — F411 Generalized anxiety disorder: Secondary | ICD-10-CM | POA: Insufficient documentation

## 2014-04-19 DIAGNOSIS — K219 Gastro-esophageal reflux disease without esophagitis: Secondary | ICD-10-CM | POA: Insufficient documentation

## 2014-04-19 HISTORY — PX: CHOLECYSTECTOMY: SHX55

## 2014-04-19 SURGERY — LAPAROSCOPIC CHOLECYSTECTOMY
Anesthesia: General

## 2014-04-19 MED ORDER — SODIUM CHLORIDE 0.9 % IR SOLN
Status: DC | PRN
  Administered 2014-04-19: 1000 mL

## 2014-04-19 MED ORDER — CIPROFLOXACIN IN D5W 400 MG/200ML IV SOLN
INTRAVENOUS | Status: AC
  Filled 2014-04-19: qty 200

## 2014-04-19 MED ORDER — FENTANYL CITRATE 0.05 MG/ML IJ SOLN
INTRAMUSCULAR | Status: AC
  Filled 2014-04-19: qty 2

## 2014-04-19 MED ORDER — CHLORHEXIDINE GLUCONATE 4 % EX LIQD: 1.0000 "application " | Freq: Once | CUTANEOUS | Status: AC

## 2014-04-19 MED ORDER — DEXAMETHASONE SODIUM PHOSPHATE 4 MG/ML IJ SOLN
INTRAMUSCULAR | Status: AC
  Filled 2014-04-19: qty 2

## 2014-04-19 MED ORDER — HEMOSTATIC AGENTS (NO CHARGE) OPTIME
TOPICAL | Status: DC | PRN
  Administered 2014-04-19: 1 via TOPICAL

## 2014-04-19 MED ORDER — MIDAZOLAM HCL 2 MG/2ML IJ SOLN
1.0000 mg | INTRAMUSCULAR | Status: DC | PRN
  Administered 2014-04-19 (×2): 1 mg via INTRAVENOUS

## 2014-04-19 MED ORDER — MIDAZOLAM HCL 2 MG/2ML IJ SOLN
INTRAMUSCULAR | Status: AC
  Filled 2014-04-19: qty 2

## 2014-04-19 MED ORDER — LACTATED RINGERS IV SOLN
INTRAVENOUS | Status: DC
  Administered 2014-04-19 (×2): via INTRAVENOUS

## 2014-04-19 MED ORDER — KETOROLAC TROMETHAMINE 30 MG/ML IJ SOLN
30.0000 mg | Freq: Once | INTRAMUSCULAR | Status: AC
  Administered 2014-04-19: 30 mg via INTRAVENOUS
  Filled 2014-04-19: qty 1

## 2014-04-19 MED ORDER — POVIDONE-IODINE 10 % EX OINT
TOPICAL_OINTMENT | CUTANEOUS | Status: AC
  Filled 2014-04-19: qty 1

## 2014-04-19 MED ORDER — DEXAMETHASONE SODIUM PHOSPHATE 4 MG/ML IJ SOLN
INTRAMUSCULAR | Status: DC | PRN
  Administered 2014-04-19: 8 mg via INTRAVENOUS

## 2014-04-19 MED ORDER — BUPIVACAINE HCL (PF) 0.5 % IJ SOLN
INTRAMUSCULAR | Status: AC
Start: 2014-04-19 — End: 2014-04-19
  Filled 2014-04-19: qty 30

## 2014-04-19 MED ORDER — NEOSTIGMINE METHYLSULFATE 1 MG/ML IJ SOLN
INTRAMUSCULAR | Status: AC
  Filled 2014-04-19: qty 1

## 2014-04-19 MED ORDER — POVIDONE-IODINE 10 % OINT PACKET
TOPICAL_OINTMENT | CUTANEOUS | Status: DC | PRN
  Administered 2014-04-19: 1 via TOPICAL

## 2014-04-19 MED ORDER — GLYCOPYRROLATE 0.2 MG/ML IJ SOLN
0.2000 mg | Freq: Once | INTRAMUSCULAR | Status: AC
  Administered 2014-04-19: 0.2 mg via INTRAVENOUS

## 2014-04-19 MED ORDER — ONDANSETRON HCL 4 MG/2ML IJ SOLN
INTRAMUSCULAR | Status: AC
  Filled 2014-04-19: qty 2

## 2014-04-19 MED ORDER — PROPOFOL 10 MG/ML IV BOLUS
INTRAVENOUS | Status: DC | PRN
  Administered 2014-04-19: 140 mg via INTRAVENOUS

## 2014-04-19 MED ORDER — PROPOFOL 10 MG/ML IV BOLUS
INTRAVENOUS | Status: AC
  Filled 2014-04-19: qty 20

## 2014-04-19 MED ORDER — LIDOCAINE HCL 1 % IJ SOLN
INTRAMUSCULAR | Status: DC | PRN
  Administered 2014-04-19: 50 mg via INTRADERMAL

## 2014-04-19 MED ORDER — ONDANSETRON HCL 4 MG/2ML IJ SOLN: 4.0000 mg | Freq: Once | INTRAMUSCULAR | Status: DC | PRN

## 2014-04-19 MED ORDER — ROCURONIUM BROMIDE 100 MG/10ML IV SOLN
INTRAVENOUS | Status: DC | PRN
  Administered 2014-04-19: 30 mg via INTRAVENOUS

## 2014-04-19 MED ORDER — NEOSTIGMINE METHYLSULFATE 1 MG/ML IJ SOLN
INTRAMUSCULAR | Status: DC | PRN
  Administered 2014-04-19: 1 mg via INTRAVENOUS

## 2014-04-19 MED ORDER — FENTANYL CITRATE 0.05 MG/ML IJ SOLN
25.0000 ug | INTRAMUSCULAR | Status: DC | PRN
  Administered 2014-04-19: 50 ug via INTRAVENOUS
  Filled 2014-04-19: qty 2

## 2014-04-19 MED ORDER — FENTANYL CITRATE 0.05 MG/ML IJ SOLN
INTRAMUSCULAR | Status: DC | PRN
Start: 2014-04-19 — End: 2014-04-19
  Administered 2014-04-19 (×3): 50 ug via INTRAVENOUS
  Administered 2014-04-19: 100 ug via INTRAVENOUS
  Administered 2014-04-19 (×2): 50 ug via INTRAVENOUS

## 2014-04-19 MED ORDER — ROCURONIUM BROMIDE 50 MG/5ML IV SOLN
INTRAVENOUS | Status: AC
  Filled 2014-04-19: qty 1

## 2014-04-19 MED ORDER — MIDAZOLAM HCL 5 MG/5ML IJ SOLN
INTRAMUSCULAR | Status: DC | PRN
  Administered 2014-04-19: 2 mg via INTRAVENOUS

## 2014-04-19 MED ORDER — ONDANSETRON HCL 4 MG/2ML IJ SOLN
4.0000 mg | Freq: Once | INTRAMUSCULAR | Status: AC
  Administered 2014-04-19: 4 mg via INTRAVENOUS

## 2014-04-19 MED ORDER — SUCCINYLCHOLINE CHLORIDE 20 MG/ML IJ SOLN
INTRAMUSCULAR | Status: DC | PRN
  Administered 2014-04-19: 140 mg via INTRAVENOUS

## 2014-04-19 MED ORDER — LIDOCAINE HCL (PF) 1 % IJ SOLN
INTRAMUSCULAR | Status: AC
  Filled 2014-04-19: qty 5

## 2014-04-19 MED ORDER — CIPROFLOXACIN IN D5W 400 MG/200ML IV SOLN
400.0000 mg | INTRAVENOUS | Status: AC
  Administered 2014-04-19: 400 mg via INTRAVENOUS

## 2014-04-19 MED ORDER — DIPHENHYDRAMINE HCL 50 MG/ML IJ SOLN
INTRAMUSCULAR | Status: AC
  Filled 2014-04-19: qty 1

## 2014-04-19 MED ORDER — GLYCOPYRROLATE 0.2 MG/ML IJ SOLN
INTRAMUSCULAR | Status: AC
  Filled 2014-04-19: qty 2

## 2014-04-19 MED ORDER — DIPHENHYDRAMINE HCL 50 MG/ML IJ SOLN
INTRAMUSCULAR | Status: DC | PRN
  Administered 2014-04-19: 50 mg via INTRAVENOUS

## 2014-04-19 MED ORDER — SUCCINYLCHOLINE CHLORIDE 20 MG/ML IJ SOLN
INTRAMUSCULAR | Status: AC
  Filled 2014-04-19: qty 1

## 2014-04-19 MED ORDER — GLYCOPYRROLATE 0.2 MG/ML IJ SOLN
INTRAMUSCULAR | Status: DC | PRN
  Administered 2014-04-19: 0.2 mg via INTRAVENOUS

## 2014-04-19 MED ORDER — FENTANYL CITRATE 0.05 MG/ML IJ SOLN
INTRAMUSCULAR | Status: AC
  Filled 2014-04-19: qty 5

## 2014-04-19 MED ORDER — BUPIVACAINE HCL (PF) 0.5 % IJ SOLN
INTRAMUSCULAR | Status: DC | PRN
  Administered 2014-04-19: 10 mL

## 2014-04-19 MED ORDER — OXYCODONE-ACETAMINOPHEN 7.5-325 MG PO TABS: 1.0000 | ORAL_TABLET | ORAL | Status: DC | PRN

## 2014-04-19 MED ORDER — GLYCOPYRROLATE 0.2 MG/ML IJ SOLN
INTRAMUSCULAR | Status: AC
  Filled 2014-04-19: qty 1

## 2014-04-19 MED ORDER — DEXTROSE 5 % IV SOLN
INTRAVENOUS | Status: DC | PRN
  Administered 2014-04-19: 13:00:00 via INTRAVENOUS

## 2014-04-19 SURGICAL SUPPLY — 44 items
APPLIER CLIP LAPSCP 10X32 DD (CLIP) ×3 IMPLANT
BAG HAMPER (MISCELLANEOUS) ×3 IMPLANT
BAG SPEC RTRVL LRG 6X4 10 (ENDOMECHANICALS) ×1
BLADE 11 SAFETY STRL DISP (BLADE) ×2 IMPLANT
CLOTH BEACON ORANGE TIMEOUT ST (SAFETY) ×3 IMPLANT
COVER LIGHT HANDLE STERIS (MISCELLANEOUS) ×6 IMPLANT
DECANTER SPIKE VIAL GLASS SM (MISCELLANEOUS) ×3 IMPLANT
DURAPREP 26ML APPLICATOR (WOUND CARE) ×3 IMPLANT
ELECT REM PT RETURN 9FT ADLT (ELECTROSURGICAL) ×3
ELECTRODE REM PT RTRN 9FT ADLT (ELECTROSURGICAL) ×1 IMPLANT
FILTER SMOKE EVAC LAPAROSHD (FILTER) ×3 IMPLANT
FORMALIN 10 PREFIL 120ML (MISCELLANEOUS) ×3 IMPLANT
GLOVE BIO SURGEON STRL SZ7.5 (GLOVE) ×3 IMPLANT
GLOVE BIOGEL PI IND STRL 7.0 (GLOVE) IMPLANT
GLOVE BIOGEL PI IND STRL 8 (GLOVE) ×1 IMPLANT
GLOVE BIOGEL PI INDICATOR 7.0 (GLOVE) ×6
GLOVE BIOGEL PI INDICATOR 8 (GLOVE) ×2
GLOVE ECLIPSE 7.5 STRL STRAW (GLOVE) ×2 IMPLANT
GLOVE SS BIOGEL STRL SZ 6.5 (GLOVE) IMPLANT
GLOVE SUPERSENSE BIOGEL SZ 6.5 (GLOVE) ×4
GOWN STRL REUS W/TWL LRG LVL3 (GOWN DISPOSABLE) ×11 IMPLANT
HEMOSTAT SNOW SURGICEL 2X4 (HEMOSTASIS) ×3 IMPLANT
INST SET LAPROSCOPIC AP (KITS) ×3 IMPLANT
KIT ROOM TURNOVER APOR (KITS) ×3 IMPLANT
MANIFOLD NEPTUNE II (INSTRUMENTS) ×3 IMPLANT
NDL INSUFFLATION 14GA 120MM (NEEDLE) ×1 IMPLANT
NEEDLE INSUFFLATION 14GA 120MM (NEEDLE) ×3 IMPLANT
NS IRRIG 1000ML POUR BTL (IV SOLUTION) ×3 IMPLANT
PACK LAP CHOLE LZT030E (CUSTOM PROCEDURE TRAY) ×3 IMPLANT
PAD ARMBOARD 7.5X6 YLW CONV (MISCELLANEOUS) ×3 IMPLANT
POUCH SPECIMEN RETRIEVAL 10MM (ENDOMECHANICALS) ×3 IMPLANT
SET BASIN LINEN APH (SET/KITS/TRAYS/PACK) ×3 IMPLANT
SLEEVE ENDOPATH XCEL 5M (ENDOMECHANICALS) ×3 IMPLANT
SPONGE GAUZE 2X2 8PLY STER LF (GAUZE/BANDAGES/DRESSINGS) ×4
SPONGE GAUZE 2X2 8PLY STRL LF (GAUZE/BANDAGES/DRESSINGS) ×8 IMPLANT
STAPLER VISISTAT (STAPLE) ×3 IMPLANT
SUT VICRYL 0 UR6 27IN ABS (SUTURE) ×3 IMPLANT
TAPE CLOTH SURG 4X10 WHT LF (GAUZE/BANDAGES/DRESSINGS) ×2 IMPLANT
TROCAR ENDO BLADELESS 11MM (ENDOMECHANICALS) ×3 IMPLANT
TROCAR XCEL NON-BLD 5MMX100MML (ENDOMECHANICALS) ×3 IMPLANT
TROCAR XCEL UNIV SLVE 11M 100M (ENDOMECHANICALS) ×3 IMPLANT
TUBING INSUFFLATION (TUBING) ×3 IMPLANT
WARMER LAPAROSCOPE (MISCELLANEOUS) ×3 IMPLANT
YANKAUER SUCT 12FT TUBE ARGYLE (SUCTIONS) ×3 IMPLANT

## 2014-04-19 NOTE — Op Note (Signed)
Patient:  Monica Lawson  DOB:  July 05, 1976  MRN:  [id number]   Preop Diagnosis:  Chronic cholecystitis  Postop Diagnosis:  Same  Procedure:  Laparoscopic cholecystectomy  Surgeon:  Aviva Signs, M.D.  Anes:  General endotracheal  Indications:  Patient is a 38 year old white female presents with biliary colic secondary to chronic cholecystitis. The risks and benefits of the procedure including bleeding, infection, hepatobiliary injury, the possibility of an open procedure were fully explained to the patient, who gave informed consent.  Procedure note:  The patient is placed the supine position. After induction of general endotracheal anesthesia, the abdomen was prepped and draped using usual sterile technique with DuraPrep. Surgical site confirmation was performed.  An infraumbilical incision was made down to the fascia. A Veress needle was introduced into the abdominal cavity and confirmation of placement was done using the saline drop test. The abdomen was then insufflated to 16 mm mercury pressure. An 11 mm trocar was introduced into the abdominal cavity under direct visualization the difficulty. The patient was placed in reverse Trendelenburg position and additional 11 mm trocar was placed the epigastric region and 5 mm trochars were placed in right upper quadrant and right flank regions. The liver was inspected and noted within normal limits. The gallbladder was retracted in a dynamic fashion in order to expose the triangle of Calot. The cystic duct was first identified. Its juncture to the infundibulum was fully identified. Endoclips were placed proximally distally on the cystic duct, and the cystic duct was divided. This was likewise done cystic artery. The gallbladder was then freed away from the gallbladder fossa using Bovie electrocautery. The gallbladder was delivered through the epigastric trocar site using an Endo Catch bag. The gallbladder fossa was inspected and no abnormal bleeding or  bile leakage was noted. Surgicel is placed the gallbladder fossa. All fluid and air were then evacuated from the abdominal cavity prior to removal of the trochars.  All wounds were irrigated with normal saline. All wounds were injected with 0.5% Sensorcaine. The infraumbilical fascia was reapproximated using 0 Vicryl interrupted suture. All skin incisions were closed using staples. Betadine ointment and dry sterile dressings were applied.  All tape and needle counts were correct at the end of the procedure. Patient was extubated in the operating room and transferred to PACU in stable condition.  Complications:  None  EBL:  Minimal  Specimen:  Gallbladder

## 2014-04-19 NOTE — Transfer of Care (Signed)
Immediate Anesthesia Transfer of Care Note  Patient: Monica Lawson  Procedure(s) Performed: Procedure(s): LAPAROSCOPIC CHOLECYSTECTOMY (N/A)  Patient Location: PACU  Anesthesia Type:General  Level of Consciousness: awake and patient cooperative  Airway & Oxygen Therapy: Patient Spontanous Breathing and Patient connected to face mask oxygen  Post-op Assessment: Report given to PACU RN, Post -op Vital signs reviewed and stable and Patient moving all extremities  Post vital signs: Reviewed and stable  Complications: No apparent anesthesia complications

## 2014-04-19 NOTE — Anesthesia Postprocedure Evaluation (Signed)
  Anesthesia Post-op Note  Patient: Monica Lawson  Procedure(s) Performed: Procedure(s): LAPAROSCOPIC CHOLECYSTECTOMY (N/A)  Patient Location: PACU  Anesthesia Type:General  Level of Consciousness: awake, alert , oriented and patient cooperative  Airway and Oxygen Therapy: Patient Spontanous Breathing  Post-op Pain: 3 /10, mild  Post-op Assessment: Post-op Vital signs reviewed, Patient's Cardiovascular Status Stable, Respiratory Function Stable, Patent Airway, No signs of Nausea or vomiting and Pain level controlled  Post-op Vital Signs: Reviewed and stable  Last Vitals:  Filed Vitals:   04/19/14 1137  BP: 118/78  Pulse: 61  Temp: 36.6 C  Resp: 24    Complications: No apparent anesthesia complications

## 2014-04-19 NOTE — Discharge Instructions (Signed)
Laparoscopic Cholecystectomy, Care After °Refer to this sheet in the next few weeks. These instructions provide you with information on caring for yourself after your procedure. Your health care provider may also give you more specific instructions. Your treatment has been planned according to current medical practices, but problems sometimes occur. Call your health care provider if you have any problems or questions after your procedure. °WHAT TO EXPECT AFTER THE PROCEDURE °After your procedure, it is typical to have the following: °· Pain at your incision sites. You will be given pain medicines to control the pain. °· Mild nausea or vomiting. This should improve after the first 24 hours. °· Bloating and possibly shoulder pain from the gas used during the procedure. This will improve after the first 24 hours. °HOME CARE INSTRUCTIONS  °· Change bandages (dressings) as directed by your health care provider. °· Keep the wound dry and clean. You may wash the wound gently with soap and water. Gently blot or dab the area dry. °· Do not take baths or use swimming pools or hot tubs for 2 weeks or until your health care provider approves. °· Only take over-the-counter or prescription medicines as directed by your health care provider. °· Continue your normal diet as directed by your health care provider. °· Do not lift anything heavier than 10 pounds (4.5 kg) until your health care provider approves. °· Do not play contact sports for 1 week or until your health care provider approves. °SEEK MEDICAL CARE IF:  °· You have redness, swelling, or increasing pain in the wound. °· You notice yellowish-white fluid (pus) coming from the wound. °· You have drainage from the wound that lasts longer than 1 day. °· You notice a bad smell coming from the wound or dressing. °· Your surgical cuts (incisions) break open. °SEEK IMMEDIATE MEDICAL CARE IF:  °· You develop a rash. °· You have difficulty breathing. °· You have chest pain. °· You  have a fever. °· You have increasing pain in the shoulders (shoulder strap areas). °· You have dizzy episodes or faint while standing. °· You have severe abdominal pain. °· You feel sick to your stomach (nauseous) or throw up (vomit) and this lasts for more than 1 day. °Document Released: 12/15/2005 Document Revised: 10/05/2013 Document Reviewed: 07/27/2013 °ExitCare® Patient Information ©2014 [employer]. ° °

## 2014-04-19 NOTE — Interval H&P Note (Signed)
History and Physical Interval Note:  04/19/2014 12:28 PM  Monica Lawson  has presented today for surgery, with the diagnosis of chronic cholecystitis  The various methods of treatment have been discussed with the patient and family. After consideration of risks, benefits and other options for treatment, the patient has consented to  Procedure(s): LAPAROSCOPIC CHOLECYSTECTOMY (N/A) as a surgical intervention .  The patient's history has been reviewed, patient examined, no change in status, stable for surgery.  I have reviewed the patient's chart and labs.  Questions were answered to the patient's satisfaction.     Jamesetta So

## 2014-04-19 NOTE — Anesthesia Procedure Notes (Signed)
Procedure Name: Intubation Date/Time: 04/19/2014 12:53 PM Performed by: Charmaine Downs Pre-anesthesia Checklist: Patient being monitored, Suction available, Emergency Drugs available and Patient identified Patient Re-evaluated:Patient Re-evaluated prior to inductionOxygen Delivery Method: Circle system utilized Preoxygenation: Pre-oxygenation with 100% oxygen Intubation Type: IV induction and Cricoid Pressure applied Ventilation: Mask ventilation without difficulty and Oral airway inserted - appropriate to patient size Laryngoscope Size: Mac and 3 Grade View: Grade I Tube type: Oral Tube size: 7.0 mm Number of attempts: 1 Airway Equipment and Method: Stylet Placement Confirmation: ETT inserted through vocal cords under direct vision,  breath sounds checked- equal and bilateral and positive ETCO2 Secured at: 22 cm Tube secured with: Tape Dental Injury: Teeth and Oropharynx as per pre-operative assessment

## 2014-04-19 NOTE — Anesthesia Preprocedure Evaluation (Signed)
Anesthesia Evaluation  Patient identified by MRN, date of birth, ID band Patient awake    Reviewed: Allergy & Precautions, H&P , NPO status , Patient's Chart, lab work & pertinent test results  Airway Mallampati: I TM Distance: >3 FB Neck ROM: Full    Dental  (+) Teeth Intact   Pulmonary neg pulmonary ROS,  breath sounds clear to auscultation        Cardiovascular negative cardio ROS  Rhythm:Regular Rate:Normal     Neuro/Psych PSYCHIATRIC DISORDERS Anxiety Depression    GI/Hepatic GERD-  ,  Endo/Other    Renal/GU      Musculoskeletal   Abdominal   Peds  Hematology   Anesthesia Other Findings   Reproductive/Obstetrics                           Anesthesia Physical Anesthesia Plan  ASA: II  Anesthesia Plan: General   Post-op Pain Management:    Induction: Intravenous, Rapid sequence and Cricoid pressure planned  Airway Management Planned: Oral ETT  Additional Equipment:   Intra-op Plan:   Post-operative Plan: Extubation in OR  Informed Consent: I have reviewed the patients History and Physical, chart, labs and discussed the procedure including the risks, benefits and alternatives for the proposed anesthesia with the patient or authorized representative who has indicated his/her understanding and acceptance.     Plan Discussed with:   Anesthesia Plan Comments:         Anesthesia Quick Evaluation

## 2014-04-21 ENCOUNTER — Encounter (HOSPITAL_COMMUNITY): Payer: Self-pay | Admitting: General Surgery

## 2014-04-21 NOTE — Addendum Note (Signed)
Addendum created 04/21/14 0754 by Ollen Bowl, CRNA   Modules edited: Anesthesia Events, Anesthesia Flowsheet

## 2014-11-15 ENCOUNTER — Other Ambulatory Visit: Payer: Self-pay | Admitting: Gynecology

## 2014-11-15 ENCOUNTER — Telehealth: Payer: Self-pay | Admitting: Gynecology

## 2014-11-15 DIAGNOSIS — Z30431 Encounter for routine checking of intrauterine contraceptive device: Secondary | ICD-10-CM

## 2014-11-15 MED ORDER — LEVONORGESTREL 20 MCG/24HR IU IUD: INTRAUTERINE_SYSTEM | Freq: Once | INTRAUTERINE | Status: AC

## 2014-11-15 NOTE — Telephone Encounter (Signed)
11/15/14-Pt was informed today that her [facility] insurance will cover the replacement of her Mirena IUD at 100%, no copay or deductible as it is for Contraception. Appt already made with TF. BC Ref #[id number] wl

## 2014-12-04 ENCOUNTER — Ambulatory Visit (INDEPENDENT_AMBULATORY_CARE_PROVIDER_SITE_OTHER): Payer: BC Managed Care – PPO | Admitting: Gynecology

## 2014-12-04 ENCOUNTER — Encounter: Payer: Self-pay | Admitting: Gynecology

## 2014-12-04 DIAGNOSIS — Z30433 Encounter for removal and reinsertion of intrauterine contraceptive device: Secondary | ICD-10-CM

## 2014-12-04 NOTE — Progress Notes (Signed)
Patient presents for Mirena IUD removal and replacement. Prior Mirena IUD placed December 2010.  She has read through the booklet, has no contraindications and signed the consent form. I reviewed the removal and insertional process with her as well as the risks to include infection, either immediate or long-term, uterine perforation or migration requiring surgery to remove, other complications such as pain, hormonal side effects, infertility and possibility of failure with subsequent pregnancy.   Exam with Kim assistant Pelvic: External BUS vagina normal. Cervix normal with IUD string visualized. Uterus anteverted normal size shape contour midline mobile nontender. Adnexa without masses or tenderness.  Procedure: The cervix was visualized with a speculum, the Mirena IUD string was grasped with the [facility] forcep and her old Mirena IUD was removed, shown to her and discarded. The cervix was then cleansed with Betadine, anterior lip grasped with a single-tooth tenaculum, the uterus was sounded and a Mirena IUD was placed according to manufacturer's recommendations without difficulty. The strings were trimmed. The patient tolerated well and will follow up in one month for a postinsertional check.  Lot number:  [id number]    Anastasio Auerbach MD, 12:58 PM 12/04/2014

## 2014-12-04 NOTE — Patient Instructions (Signed)
Intrauterine Device Insertion Most often, an intrauterine device (IUD) is inserted into the uterus to prevent pregnancy. There are 2 types of IUDs available:  Copper IUD--This type of IUD creates an environment that is not favorable to sperm survival. The mechanism of action of the copper IUD is not known for certain. It can stay in place for 10 years.  Hormone IUD--This type of IUD contains the hormone progestin (synthetic progesterone). The progestin thickens the cervical mucus and prevents sperm from entering the uterus, and it also thins the uterine lining. There is no evidence that the hormone IUD prevents implantation. One hormone IUD can stay in place for up to 5 years, and a different hormone IUD can stay in place for up to 3 years. An IUD is the most cost-effective birth control if left in place for the full duration. It may be removed at any time. LET YOUR HEALTH CARE PROVIDER KNOW ABOUT:  Any allergies you have.  All medicines you are taking, including vitamins, herbs, eye drops, creams, and over-the-counter medicines.  Previous problems you or members of your family have had with the use of anesthetics.  Any blood disorders you have.  Previous surgeries you have had.  Possibility of pregnancy.  Medical conditions you have. RISKS AND COMPLICATIONS  Generally, intrauterine device insertion is a safe procedure. However, as with any procedure, complications can occur. Possible complications include:  Accidental puncture (perforation) of the uterus.  Accidental placement of the IUD either in the muscle layer of the uterus (myometrium) or outside the uterus. If this happens, the IUD can be found essentially floating around the bowels and must be taken out surgically.  The IUD may fall out of the uterus (expulsion). This is more common in women who have recently had a child.   Pregnancy in the fallopian tube (ectopic).  Pelvic inflammatory disease (PID), which is infection of  the uterus and fallopian tubes. The risk of PID is slightly increased in the first 20 days after the IUD is placed, but the overall risk is still very low. BEFORE THE PROCEDURE  Schedule the IUD insertion for when you will have your menstrual period or right after, to make sure you are not pregnant. Placement of the IUD is better tolerated shortly after a menstrual cycle.  You may need to take tests or be examined to make sure you are not pregnant.  You may be required to take a pregnancy test.  You may be required to get checked for sexually transmitted infections (STIs) prior to placement. Placing an IUD in someone who has an infection can make the infection worse.  You may be given a pain reliever to take 1 or 2 hours before the procedure.  An exam will be performed to determine the size and position of your uterus.  Ask your health care provider about changing or stopping your regular medicines. PROCEDURE   A tool (speculum) is placed in the vagina. This allows your health care provider to see the lower part of the uterus (cervix).  The cervix is prepped with a medicine that lowers the risk of infection.  You may be given a medicine to numb each side of the cervix (intracervical or paracervical block). This is used to block and control any discomfort with insertion.  A tool (uterine sound) is inserted into the uterus to determine the length of the uterine cavity and the direction the uterus may be tilted.  A slim instrument (IUD inserter) is inserted through the cervical   canal and into your uterus.  The IUD is placed in the uterine cavity and the insertion device is removed.  The nylon string that is attached to the IUD and used for eventual IUD removal is trimmed. It is trimmed so that it lays high in the vagina, just outside the cervix. AFTER THE PROCEDURE  You may have bleeding after the procedure. This is normal. It varies from light spotting for a few days to menstrual-like  bleeding.  You may have mild cramping. Document Released: 08/13/2011 Document Revised: 10/05/2013 Document Reviewed: 06/05/2013 ExitCare Patient Information 2015 [employer]. This information is not intended to replace advice given to you by your health care provider. Make sure you discuss any questions you have with your health care provider.  

## 2014-12-05 ENCOUNTER — Encounter: Payer: Self-pay | Admitting: Gynecology

## 2015-01-04 ENCOUNTER — Ambulatory Visit (INDEPENDENT_AMBULATORY_CARE_PROVIDER_SITE_OTHER): Payer: BLUE CROSS/BLUE SHIELD | Admitting: Gynecology

## 2015-01-04 ENCOUNTER — Encounter: Payer: Self-pay | Admitting: Gynecology

## 2015-01-04 DIAGNOSIS — Z30431 Encounter for routine checking of intrauterine contraceptive device: Secondary | ICD-10-CM

## 2015-01-04 NOTE — Patient Instructions (Signed)
Follow up in another month or 2 and you're due for your annual exam.

## 2015-01-04 NOTE — Progress Notes (Signed)
Monica Lawson July 27, 1976 [id number]        39 y.o.  G0P0 presents for IUD follow up exam. Had Mirena IUD placed 12/04/2014. Doing well without issues  Past medical history,surgical history, problem list, medications, allergies, family history and social history were all reviewed and documented in the EPIC chart.  Directed ROS with pertinent positives and negatives documented in the history of present illness/assessment and plan.  Exam: Kim assistant General appearance:  Normal Abdomen soft nontender without masses guarding rebound External BUS vagina normal. Cervix normal with IUD strings visualized and normal length. Uterus normal size midline mobile nontender. Adnexa without masses or tenderness.  Assessment/Plan:  39 y.o. G0P0 with normal follow up IUD check. Will schedule her annual exam in the next 1-2 months when she is due.     Anastasio Auerbach MD, 12:11 PM 01/04/2015

## 2015-02-09 ENCOUNTER — Encounter: Payer: Self-pay | Admitting: Women's Health

## 2015-02-09 ENCOUNTER — Ambulatory Visit (INDEPENDENT_AMBULATORY_CARE_PROVIDER_SITE_OTHER): Payer: BLUE CROSS/BLUE SHIELD | Admitting: Women's Health

## 2015-02-09 VITALS — BP 119/75 | Ht 63.0 in | Wt 109.6 lb

## 2015-02-09 DIAGNOSIS — C189 Malignant neoplasm of colon, unspecified: Secondary | ICD-10-CM | POA: Insufficient documentation

## 2015-02-09 DIAGNOSIS — Z01419 Encounter for gynecological examination (general) (routine) without abnormal findings: Secondary | ICD-10-CM

## 2015-02-09 DIAGNOSIS — Z13220 Encounter for screening for lipoid disorders: Secondary | ICD-10-CM

## 2015-02-09 LAB — TSH: TSH: 0.644 u[IU]/mL (ref 0.350–4.500)

## 2015-02-09 LAB — CBC WITH DIFFERENTIAL/PLATELET
BASOS ABS: 0 10*3/uL (ref 0.0–0.1)
BASOS PCT: 0 % (ref 0–1)
EOS PCT: 0 % (ref 0–5)
Eosinophils Absolute: 0 10*3/uL (ref 0.0–0.7)
HCT: 40.6 % (ref 36.0–46.0)
Hemoglobin: 14 g/dL (ref 12.0–15.0)
Lymphocytes Relative: 29 % (ref 12–46)
Lymphs Abs: 2.1 10*3/uL (ref 0.7–4.0)
MCH: 31.3 pg (ref 26.0–34.0)
MCHC: 34.5 g/dL (ref 30.0–36.0)
MCV: 90.6 fL (ref 78.0–100.0)
MPV: 9.9 fL (ref 8.6–12.4)
Monocytes Absolute: 0.7 10*3/uL (ref 0.1–1.0)
Monocytes Relative: 9 % (ref 3–12)
Neutro Abs: 4.5 10*3/uL (ref 1.7–7.7)
Neutrophils Relative %: 62 % (ref 43–77)
Platelets: 302 10*3/uL (ref 150–400)
RBC: 4.48 MIL/uL (ref 3.87–5.11)
RDW: 14 % (ref 11.5–15.5)
WBC: 7.3 10*3/uL (ref 4.0–10.5)

## 2015-02-09 LAB — COMPREHENSIVE METABOLIC PANEL
ALBUMIN: 4.5 g/dL (ref 3.5–5.2)
ALT: 14 U/L (ref 0–35)
AST: 22 U/L (ref 0–37)
Alkaline Phosphatase: 71 U/L (ref 39–117)
BILIRUBIN TOTAL: 0.4 mg/dL (ref 0.2–1.2)
BUN: 11 mg/dL (ref 6–23)
CALCIUM: 9.5 mg/dL (ref 8.4–10.5)
CO2: 24 meq/L (ref 19–32)
Chloride: 105 mEq/L (ref 96–112)
Creat: 0.63 mg/dL (ref 0.50–1.10)
GLUCOSE: 86 mg/dL (ref 70–99)
Potassium: 4.2 mEq/L (ref 3.5–5.3)
Sodium: 138 mEq/L (ref 135–145)
Total Protein: 7.1 g/dL (ref 6.0–8.3)

## 2015-02-09 LAB — LIPID PANEL
CHOL/HDL RATIO: 2.3 ratio
CHOLESTEROL: 142 mg/dL (ref 0–200)
HDL: 63 mg/dL (ref >39)
LDL Cholesterol: 68 mg/dL (ref 0–99)
TRIGLYCERIDES: 57 mg/dL (ref <150)
VLDL: 11 mg/dL (ref 0–40)

## 2015-02-09 NOTE — Patient Instructions (Signed)

## 2015-02-09 NOTE — Progress Notes (Signed)
LARIAH FLEER [birth year] [id number]    History:    Presents for annual exam.  Mirena IUD removed and replaced 11/2014 amenorrhea. Normal Paps for greater than 5 years and mammogram history 02/2014. 03/2014 cholecystectomy. 01/2014 was having right lower quadrant pain had a normal GYN ultrasound followed up with GI colonoscopy revealed carcinoid polyp has follow-up scheduled next month for repeat colonoscopy. 3 grandparents with history of colon cancer. Normal Pap history. Mother breast cancer late 61s survivor. Anxiety/depression psychiatrist manages meds and counseling. Desires no children due to anxiety/depression problems.  Past medical history, past surgical history, family history and social history were all reviewed and documented in the EPIC chart. Works for a [employer]. Father history of depression.  ROS:  A ROS was performed and pertinent positives and negatives are included.  Exam:  Filed Vitals:   02/09/15 1454  BP: 119/75    General appearance:  Normal Thyroid:  Symmetrical, normal in size, without palpable masses or nodularity. Respiratory  Auscultation:  Clear without wheezing or rhonchi Cardiovascular  Auscultation:  Regular rate, without rubs, murmurs or gallops  Edema/varicosities:  Not grossly evident Abdominal  Soft,nontender, without masses, guarding or rebound.  Liver/spleen:  No organomegaly noted  Hernia:  None appreciated  Skin  Inspection:  Grossly normal   Breasts: Examined lying and sitting/breast reduction.     Right: Without masses, retractions, discharge or axillary adenopathy.     Left: Without masses, retractions, discharge or axillary adenopathy. Gentitourinary   Inguinal/mons:  Normal without inguinal adenopathy  External genitalia:  Normal  BUS/Urethra/Skene's glands:  Normal  Vagina:  Normal  Cervix:  Normal IUD strings visible  Uterus:  normal in size, shape and contour.  Midline and mobile  Adnexa/parametria:     Rt: Without masses or  tenderness.   Lt: Without masses or tenderness.  Anus and perineum: Normal  Digital rectal exam: Normal sphincter tone without palpated masses or tenderness  Assessment/Plan:  39 y.o. M WF G0 for annual exam.     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 SBE's, regular exercise, calcium rich diet, MVI daily encouraged. CBC, lipid panel, CMP, TSH, UA, Pap normal 2015, new screening guidelines reviewed.  [place], 4:05 PM 02/09/2015

## 2015-03-08 ENCOUNTER — Other Ambulatory Visit: Payer: Self-pay | Admitting: Gastroenterology

## 2015-03-08 DIAGNOSIS — R1111 Vomiting without nausea: Secondary | ICD-10-CM

## 2015-03-15 ENCOUNTER — Ambulatory Visit (HOSPITAL_COMMUNITY)
Admission: RE | Admit: 2015-03-15 | Discharge: 2015-03-15 | Disposition: A | Discharge status: Home / Self Care | Payer: BLUE CROSS/BLUE SHIELD | Source: Ambulatory Visit | Attending: Gastroenterology | Admitting: Gastroenterology

## 2015-03-15 DIAGNOSIS — R1110 Vomiting, unspecified: Secondary | ICD-10-CM | POA: Diagnosis not present

## 2015-03-15 DIAGNOSIS — R1111 Vomiting without nausea: Secondary | ICD-10-CM

## 2015-03-26 ENCOUNTER — Encounter (HOSPITAL_COMMUNITY): Payer: BLUE CROSS/BLUE SHIELD

## 2015-07-12 ENCOUNTER — Ambulatory Visit (INDEPENDENT_AMBULATORY_CARE_PROVIDER_SITE_OTHER): Payer: BLUE CROSS/BLUE SHIELD | Admitting: Gynecology

## 2015-07-12 ENCOUNTER — Telehealth: Payer: Self-pay | Admitting: *Deleted

## 2015-07-12 ENCOUNTER — Ambulatory Visit (INDEPENDENT_AMBULATORY_CARE_PROVIDER_SITE_OTHER): Payer: BLUE CROSS/BLUE SHIELD

## 2015-07-12 ENCOUNTER — Encounter: Payer: Self-pay | Admitting: Gynecology

## 2015-07-12 ENCOUNTER — Other Ambulatory Visit: Payer: Self-pay | Admitting: Gynecology

## 2015-07-12 VITALS — BP 116/70 | Temp 97.5°F

## 2015-07-12 DIAGNOSIS — R102 Pelvic and perineal pain: Secondary | ICD-10-CM | POA: Diagnosis not present

## 2015-07-12 DIAGNOSIS — N8320 Unspecified ovarian cysts: Secondary | ICD-10-CM | POA: Diagnosis not present

## 2015-07-12 DIAGNOSIS — N83202 Unspecified ovarian cyst, left side: Secondary | ICD-10-CM

## 2015-07-12 DIAGNOSIS — R1031 Right lower quadrant pain: Secondary | ICD-10-CM

## 2015-07-12 LAB — URINALYSIS W MICROSCOPIC + REFLEX CULTURE
Bilirubin Urine: NEGATIVE
Glucose, UA: NEGATIVE mg/dL
HGB URINE DIPSTICK: NEGATIVE
KETONES UR: NEGATIVE mg/dL
Leukocytes, UA: NEGATIVE
Nitrite: NEGATIVE
PROTEIN: NEGATIVE mg/dL
UROBILINOGEN UA: 0.2 mg/dL (ref 0.0–1.0)
pH: 5 (ref 5.0–8.0)

## 2015-07-12 LAB — PREGNANCY, URINE: PREG TEST UR: NEGATIVE

## 2015-07-12 NOTE — Patient Instructions (Signed)
Follow up if the pain persists or worsens as we discussed.

## 2015-07-12 NOTE — Addendum Note (Signed)
Addended by: Nelva Nay on: 07/12/2015 04:24 PM   Modules accepted: Orders

## 2015-07-12 NOTE — Telephone Encounter (Signed)
(  You are back up MD) pt called c/o lower right pelvic discomfort sharp, x 2 days now. Pain level at 6 now. Sharp pain comes and goes, worse when sitting, better when standing. Has urge to use bathroom and passes bright red clumpy-like tissue, has Mirena IUD, has a light cycle 2 weeks ago, pt had ruptured cyst in past. Work in patient? Please advise

## 2015-07-12 NOTE — Progress Notes (Addendum)
Monica Lawson [birth year] [id number]        38 y.o.  G0P0 presents with several day history of worsening right lower quadrant pain. No fever/chills, nausea/vomiting. Does have some diarrhea. No frequency dysuria or urgency. Has Mirena IUD in place with regular monthly menses. Did have some spotting yesterday. Past history of ovarian cysts and wonders whether she is developing another one.  Past medical history,surgical history, problem list, medications, allergies, family history and social history were all reviewed and documented in the EPIC chart.  Directed ROS with pertinent positives and negatives documented in the history of present illness/assessment and plan.  Exam: Kim assistant Filed Vitals:   07/12/15 1133  BP: 116/70  Temp: 97.5 F (36.4 C)  TempSrc: Oral   General appearance:  Normal Lungs clear Cardiac regular rate no rubs murmurs or gallops Spine straight without CVA tenderness Abdomen soft with active bowel sounds. Mild tenderness right lower quadrant to deep palpation. No rebound guarding masses or organomegaly. Pelvic external BUS vagina normal. Cervix normal with IUD string visualized. Uterus normal size, axial, midline, mobile, nontender.  Adnexa with some tenderness on the right no palpable masses. Left without masses or tenderness  UPT negative Urinalysis negative  Ultrasound shows uterus normal size and echotexture. Endometrial echo 2.8 mm. IUD visualized in proper location. Right ovary normal with normal vasculature. Left ovary with thin walled echo-free avascular cyst 29 mm mean. Cul-de-sac negative.  Assessment/Plan:  39 y.o. G0P0 right lower quadrant pain. Small physiologic appearing left ovarian cyst.  Question GI etiology. Recommend observation for present, OTC pain medication. If pain would persist or certainly worsen they would recommend GI evaluation/ER acute visit. Differential to include appendicitis reviewed. If pain resolves then follow expectantly. If  persists, patient will call. I had to leave before the patient completed her ultrasound during her visit and I called her afterwards and relayed the above information.    Anastasio Auerbach MD, 12:01 PM 07/12/2015

## 2015-07-12 NOTE — Telephone Encounter (Signed)
Pt is on her way, works across the street.

## 2015-07-12 NOTE — Telephone Encounter (Signed)
Office visit this morning

## 2015-07-29 IMAGING — NM NM HEPATO W/GB/PHARM/[PERSON_NAME]
3 series · 13 of 13 positions shown · non-contrast
Comparison: CT ABD/PELVIS W CM dated 02/02/2013

CLINICAL DATA: Right upper quadrant abdominal pain radiating
posteriorly to the right ribs, nausea, evaluate for biliary
dyskinesia

EXAM:
NUCLEAR MEDICINE HEPATOBILIARY IMAGING WITH GALLBLADDER EF
TECHNIQUE: Sequential images of the abdomen were obtained [DATE] minutes
following intravenous administration of radiopharmaceutical. After
slow intravenous infusion of 1.03 micrograms Cholecystokinin,
gallbladder ejection fraction was determined.
RADIOPHARMACEUTICALS:  5 mIiOc-00m Choletec

[he hepatobiliary · 3.43mm/px · 6 of 60 frames shown (1 of 3)]
[frame 6/60]
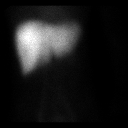
[frame 16/60]
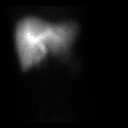
[frame 26/60]
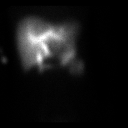
[frame 36/60]
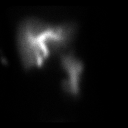
[frame 46/60]
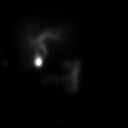
[frame 56/60]
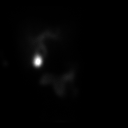

[he hepatobiliary · 1 of 1 slices shown (2 of 3)]
[im 1/1]
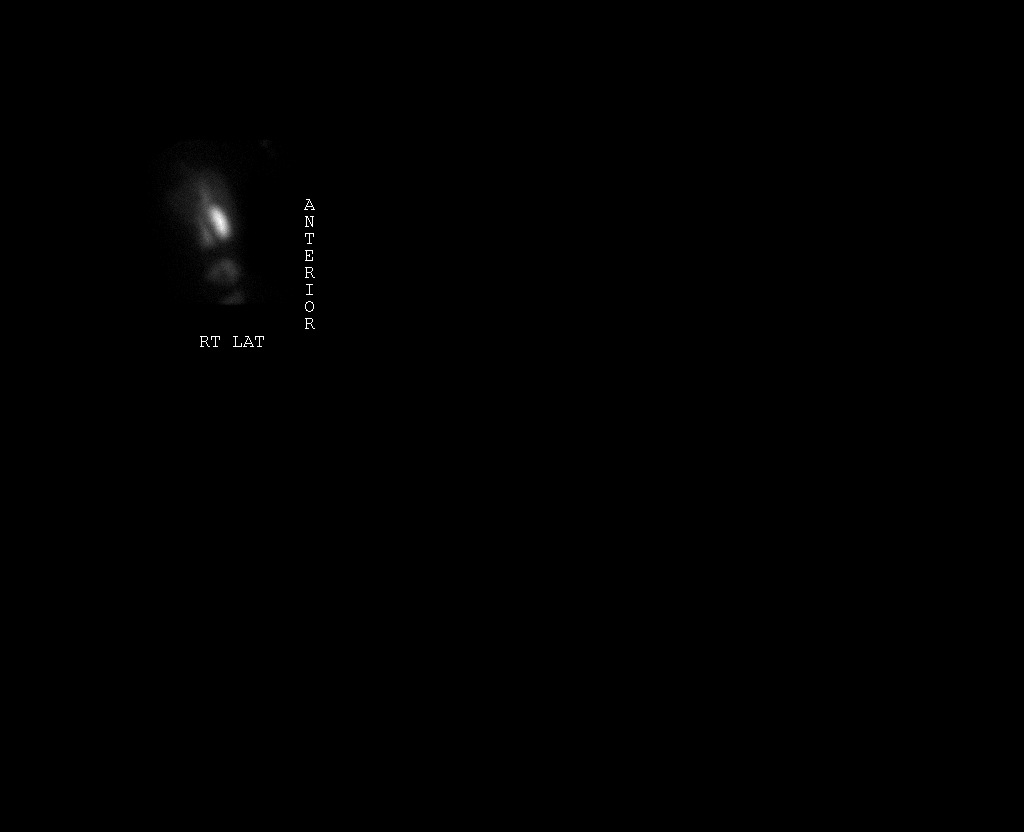

[he hepatobiliary · 3.43mm/px · 6 of 30 frames shown (3 of 3)]
[frame 3/30]
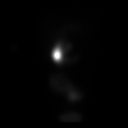
[frame 8/30]
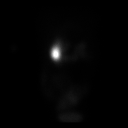
[frame 13/30]
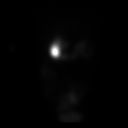
[frame 18/30]
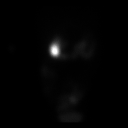
[frame 23/30]
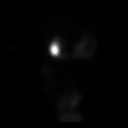
[frame 28/30]
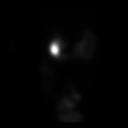

[13 of 13 positions shown; findings below may reference images not displayed]

FINDINGS: There is homogeneous distribution of injected radiotracer throughout
the hepatic parenchyma. There is early excretion of radiotracer with
opacification of the proximal small bowel, initially seen on the 25
min anterior projection planar image.

There is early opacification of the gallbladder, initially seen on
the 20 min anterior projection planar image.

Following the injection of CCK, there is sluggish emptying of the
gallbladder with gallbladder ejection fraction of 12.1% (At 30 min,
normal ejection fraction is greater than 30%.) Additionally, the
patient experienced abdominal pain during the CCK administration.
IMPRESSION: 1. No scintigraphic evidence of acute cholecystitis.
2. Findings compatible with biliary dyskinesia with decreased
gallbladder ejection fraction and the patient experiencing abdominal
pain during the CCK administration.

## 2016-02-12 ENCOUNTER — Ambulatory Visit (INDEPENDENT_AMBULATORY_CARE_PROVIDER_SITE_OTHER): Payer: BLUE CROSS/BLUE SHIELD | Admitting: Women's Health

## 2016-02-12 ENCOUNTER — Encounter: Payer: Self-pay | Admitting: Women's Health

## 2016-02-12 VITALS — BP 118/80 | Ht 63.0 in | Wt 108.0 lb

## 2016-02-12 DIAGNOSIS — B373 Candidiasis of vulva and vagina: Secondary | ICD-10-CM | POA: Diagnosis not present

## 2016-02-12 DIAGNOSIS — Z01419 Encounter for gynecological examination (general) (routine) without abnormal findings: Secondary | ICD-10-CM

## 2016-02-12 DIAGNOSIS — B3731 Acute candidiasis of vulva and vagina: Secondary | ICD-10-CM

## 2016-02-12 MED ORDER — FLUCONAZOLE 150 MG PO TABS: 150.0000 mg | ORAL_TABLET | Freq: Once | ORAL | Status: DC

## 2016-02-12 NOTE — Patient Instructions (Signed)
Generalized Anxiety Disorder Generalized anxiety disorder (GAD) is a mental disorder. It interferes with life functions, including relationships, work, and school. GAD is different from normal anxiety, which everyone experiences at some point in their lives in response to specific life events and activities. Normal anxiety actually helps [place] prepare for and get through these life events and activities. Normal anxiety goes away after the event or activity is over.  GAD causes anxiety that is not necessarily related to specific events or activities. It also causes excess anxiety in proportion to specific events or activities. The anxiety associated with GAD is also difficult to control. GAD can vary from mild to severe. People with severe GAD can have intense waves of anxiety with physical symptoms (panic attacks).  SYMPTOMS The anxiety and worry associated with GAD are difficult to control. This anxiety and worry are related to many life events and activities and also occur more days than not for 6 months or longer. People with GAD also have three or more of the following symptoms (one or more in children):  Restlessness.   Fatigue.  Difficulty concentrating.   Irritability.  Muscle tension.  Difficulty sleeping or unsatisfying sleep. DIAGNOSIS GAD is diagnosed through an assessment by your health care provider. Your health care provider will ask you questions aboutyour mood,physical symptoms, and events in your life. Your health care provider may ask you about your medical history and use of alcohol or drugs, including prescription medicines. Your health care provider may also do a physical exam and blood tests. Certain medical conditions and the use of certain substances can cause symptoms similar to those associated with GAD. Your health care provider may refer you to a mental health specialist for further evaluation. TREATMENT The following therapies are usually used to treat GAD:    Medication. Antidepressant medication usually is prescribed for long-term daily control. Antianxiety medicines may be added in severe cases, especially when panic attacks occur.   Talk therapy (psychotherapy). Certain types of talk therapy can be helpful in treating GAD by providing support, education, and guidance. A form of talk therapy called cognitive behavioral therapy can teach you healthy ways to think about and react to daily life events and activities.  Stress managementtechniques. These include yoga, meditation, and exercise and can be very helpful when they are practiced regularly. A mental health specialist can help determine which treatment is best for you. Some people see improvement with one therapy. However, other people require a combination of therapies.   This information is not intended to replace advice given to you by your health care provider. Make sure you discuss any questions you have with your health care provider.   Document Released: 04/11/2013 Document Revised: 01/05/2015 Document Reviewed: 04/11/2013 Elsevier Interactive Patient Education 2016 [place] Maintenance, Female Adopting a healthy lifestyle and getting preventive care can go a long way to promote health and wellness. Talk with your health care provider about what schedule of regular examinations is right for you. This is a good chance for you to check in with your provider about disease prevention and staying healthy. In between checkups, there are plenty of things you can do on your own. Experts have done a lot of research about which lifestyle changes and preventive measures are most likely to keep you healthy. Ask your health care provider for more information. WEIGHT AND DIET  Eat a healthy diet  Be sure to include plenty of vegetables, fruits, low-fat dairy products, and lean protein.  Do not  eat a lot of foods high in solid fats, added sugars, or salt.  Get regular exercise. This  is one of the most important things you can do for your health.  Most adults should exercise for at least 150 minutes each week. The exercise should increase your heart rate and make you sweat (moderate-intensity exercise).  Most adults should also do strengthening exercises at least twice a week. This is in addition to the moderate-intensity exercise.  Maintain a healthy weight  Body mass index (BMI) is a measurement that can be used to identify possible weight problems. It estimates body fat based on height and weight. Your health care provider can help determine your BMI and help you achieve or maintain a healthy weight.  For females 38 years of age and older:   A BMI below 18.5 is considered underweight.  A BMI of 18.5 to 24.9 is normal.  A BMI of 25 to 29.9 is considered overweight.  A BMI of 30 and above is considered obese.  Watch levels of cholesterol and blood lipids  You should start having your blood tested for lipids and cholesterol at 38 years of age, then have this test every 5 years.  You may need to have your cholesterol levels checked more often if:  Your lipid or cholesterol levels are high.  You are older than 38 years of age.  You are at high risk for heart disease.  CANCER SCREENING   Lung Cancer  Lung cancer screening is recommended for adults 7-57 years old who are at high risk for lung cancer because of a history of smoking.  A yearly low-dose CT scan of the lungs is recommended for people who:  Currently smoke.  Have quit within the past 15 years.  Have at least a 30-pack-year history of smoking. A pack year is smoking an average of one pack of cigarettes a day for 1 year.  Yearly screening should continue until it has been 15 years since you quit.  Yearly screening should stop if you develop a health problem that would prevent you from having lung cancer treatment.  Breast Cancer  Practice breast self-awareness. This means understanding  how your breasts normally appear and feel.  It also means doing regular breast self-exams. Let your health care provider know about any changes, no matter how small.  If you are in your 20s or 30s, you should have a clinical breast exam (CBE) by a health care provider every 1-3 years as part of a regular health exam.  If you are 29 or older, have a CBE every year. Also consider having a breast X-ray (mammogram) every year.  If you have a family history of breast cancer, talk to your health care provider about genetic screening.  If you are at high risk for breast cancer, talk to your health care provider about having an MRI and a mammogram every year.  Breast cancer gene (BRCA) assessment is recommended for women who have family members with BRCA-related cancers. BRCA-related cancers include:  Breast.  Ovarian.  Tubal.  Peritoneal cancers.  Results of the assessment will determine the need for genetic counseling and BRCA1 and BRCA2 testing. Cervical Cancer Your health care provider may recommend that you be screened regularly for cancer of the pelvic organs (ovaries, uterus, and vagina). This screening involves a pelvic examination, including checking for microscopic changes to the surface of your cervix (Pap test). You may be encouraged to have this screening done every 3 years, beginning at  age 91.  For women ages 66-65, health care providers may recommend pelvic exams and Pap testing every 3 years, or they may recommend the Pap and pelvic exam, combined with testing for human papilloma virus (HPV), every 5 years. Some types of HPV increase your risk of cervical cancer. Testing for HPV may also be done on women of any age with unclear Pap test results.  Other health care providers may not recommend any screening for nonpregnant women who are considered low risk for pelvic cancer and who do not have symptoms. Ask your health care provider if a screening pelvic exam is right for  you.  If you have had past treatment for cervical cancer or a condition that could lead to cancer, you need Pap tests and screening for cancer for at least 20 years after your treatment. If Pap tests have been discontinued, your risk factors (such as having a new sexual partner) need to be reassessed to determine if screening should resume. Some women have medical problems that increase the chance of getting cervical cancer. In these cases, your health care provider may recommend more frequent screening and Pap tests. Colorectal Cancer  This type of cancer can be detected and often prevented.  Routine colorectal cancer screening usually begins at 38 years of age and continues through 38 years of age.  Your health care provider may recommend screening at an earlier age if you have risk factors for colon cancer.  Your health care provider may also recommend using home test kits to check for hidden blood in the stool.  A small camera at the end of a tube can be used to examine your colon directly (sigmoidoscopy or colonoscopy). This is done to check for the earliest forms of colorectal cancer.  Routine screening usually begins at age 46.  Direct examination of the colon should be repeated every 5-10 years through 38 years of age. However, you may need to be screened more often if early forms of precancerous polyps or small growths are found. Skin Cancer  Check your skin from head to toe regularly.  Tell your health care provider about any new moles or changes in moles, especially if there is a change in a mole's shape or color.  Also tell your health care provider if you have a mole that is larger than the size of a pencil eraser.  Always use sunscreen. Apply sunscreen liberally and repeatedly throughout the day.  Protect yourself by wearing long sleeves, pants, a wide-brimmed hat, and sunglasses whenever you are outside. HEART DISEASE, DIABETES, AND HIGH BLOOD PRESSURE   High blood  pressure causes heart disease and increases the risk of stroke. High blood pressure is more likely to develop in:  People who have blood pressure in the high end of the normal range (130-139/85-89 mm Hg).  People who are overweight or obese.  People who are African American.  If you are 10-97 years of age, have your blood pressure checked every 3-5 years. If you are 37 years of age or older, have your blood pressure checked every year. You should have your blood pressure measured twice--once when you are at a hospital or clinic, and once when you are not at a hospital or clinic. Record the average of the two measurements. To check your blood pressure when you are not at a hospital or clinic, you can use:  An automated blood pressure machine at a pharmacy.  A home blood pressure monitor.  If you are between 3  years and 45 years old, ask your health care provider if you should take aspirin to prevent strokes.  Have regular diabetes screenings. This involves taking a blood sample to check your fasting blood sugar level.  If you are at a normal weight and have a low risk for diabetes, have this test once every three years after 38 years of age.  If you are overweight and have a high risk for diabetes, consider being tested at a younger age or more often. PREVENTING INFECTION  Hepatitis B  If you have a higher risk for hepatitis B, you should be screened for this virus. You are considered at high risk for hepatitis B if:  You were born in a country where hepatitis B is common. Ask your health care provider which countries are considered high risk.  Your parents were born in a high-risk country, and you have not been immunized against hepatitis B (hepatitis B vaccine).  You have HIV or AIDS.  You use needles to inject street drugs.  You live with someone who has hepatitis B.  You have had sex with someone who has hepatitis B.  You get hemodialysis treatment.  You take certain  medicines for conditions, including cancer, organ transplantation, and autoimmune conditions. Hepatitis C  Blood testing is recommended for:  Everyone born from [birth year] through 1963.  Anyone with known risk factors for hepatitis C. Sexually transmitted infections (STIs)  You should be screened for sexually transmitted infections (STIs) including gonorrhea and chlamydia if:  You are sexually active and are younger than 38 years of age.  You are older than 38 years of age and your health care provider tells you that you are at risk for this type of infection.  Your sexual activity has changed since you were last screened and you are at an increased risk for chlamydia or gonorrhea. Ask your health care provider if you are at risk.  If you do not have HIV, but are at risk, it may be recommended that you take a prescription medicine daily to prevent HIV infection. This is called pre-exposure prophylaxis (PrEP). You are considered at risk if:  You are sexually active and do not regularly use condoms or know the HIV status of your partner(s).  You take drugs by injection.  You are sexually active with a partner who has HIV. Talk with your health care provider about whether you are at high risk of being infected with HIV. If you choose to begin PrEP, you should first be tested for HIV. You should then be tested every 3 months for as long as you are taking PrEP.  PREGNANCY   If you are premenopausal and you may become pregnant, ask your health care provider about preconception counseling.  If you may become pregnant, take 400 to 800 micrograms (mcg) of folic acid every day.  If you want to prevent pregnancy, talk to your health care provider about birth control (contraception). OSTEOPOROSIS AND MENOPAUSE   Osteoporosis is a disease in which the bones lose minerals and strength with aging. This can result in serious bone fractures. Your risk for osteoporosis can be identified using a bone  density scan.  If you are 2 years of age or older, or if you are at risk for osteoporosis and fractures, ask your health care provider if you should be screened.  Ask your health care provider whether you should take a calcium or vitamin D supplement to lower your risk for osteoporosis.  Menopause may have  certain physical symptoms and risks.  Hormone replacement therapy may reduce some of these symptoms and risks. Talk to your health care provider about whether hormone replacement therapy is right for you.  HOME CARE INSTRUCTIONS   Schedule regular health, dental, and eye exams.  Stay current with your immunizations.   Do not use any tobacco products including cigarettes, chewing tobacco, or electronic cigarettes.  If you are pregnant, do not drink alcohol.  If you are breastfeeding, limit how much and how often you drink alcohol.  Limit alcohol intake to no more than 1 drink per day for nonpregnant women. One drink equals 12 ounces of beer, 5 ounces of wine, or 1 ounces of hard liquor.  Do not use street drugs.  Do not share needles.  Ask your health care provider for help if you need support or information about quitting drugs.  Tell your health care provider if you often feel depressed.  Tell your health care provider if you have ever been abused or do not feel safe at home.   This information is not intended to replace advice given to you by your health care provider. Make sure you discuss any questions you have with your health care provider.   Document Released: 06/30/2011 Document Revised: 01/05/2015 Document Reviewed: 11/16/2013 Elsevier Interactive Patient Education [employer].

## 2016-02-12 NOTE — Progress Notes (Signed)
Monica Lawson 1976-01-30 [id number]    History:    Presents for annual exam.  11/2014 Mirena IUD amenorrhea. Many years ago LGSIL with normal Paps. Mother had breast cancer at age 38 has had several normal screening mammograms. 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 Desires no children, has problems with anxiety, panic, depression sees a counselor and psychiatrist for medications.  Past medical history, past surgical history, family history and social history were all reviewed and documented in the EPIC chart. 03/2014 cholecystectomy. Works for a [employer] therapist. Father also struggles with depression.  ROS:  A ROS was performed and pertinent positives and negatives are included.  Exam:  Filed Vitals:   02/12/16 1452  BP: 118/80    General appearance:  Normal Thyroid:  Symmetrical, normal in size, without palpable masses or nodularity. Respiratory  Auscultation:  Clear without wheezing or rhonchi Cardiovascular  Auscultation:  Regular rate, without rubs, murmurs or gallops  Edema/varicosities:  Not grossly evident Abdominal  Soft,nontender, without masses, guarding or rebound.  Liver/spleen:  No organomegaly noted  Hernia:  None appreciated  Skin  Inspection:  Grossly normal   Breasts: Examined lying and sitting.     Right: Without masses, retractions, discharge or axillary adenopathy.     Left: Without masses, retractions, discharge or axillary adenopathy. Gentitourinary   Inguinal/mons:  Normal without inguinal adenopathy  External genitalia:  Normal  BUS/Urethra/Skene's glands:  Normal  Vagina:  Normal  Cervix:  Normal IUD strings visible  Uterus:  normal in size, shape and contour.  Midline and mobile  Adnexa/parametria:     Rt: Without masses or tenderness.   Lt: Without masses or tenderness.  Anus and perineum: Normal  Digital rectal exam: Normal sphincter tone without palpated masses or tenderness  Assessment/Plan:  40 y.o. M WF G0  for annual exam with no complaints.  11/2014 Mirena IUD - amenorrhea Anxiety and depression-psychiatrist andcounselor manages Primary care manages labs  Plan: SBE's, continue annual screening mammogram, 3-D tomography reviewed and encouraged history of breast reduction. Continue healthy lifestyle of exercise and diet, leisure activities encouraged, vitamin D 1000 daily encouraged. Pap normal 01/2014 will repeat next year, new screening guidelines reviewed.    [name], 5:01 PM 02/12/2016

## 2016-09-26 ENCOUNTER — Other Ambulatory Visit: Payer: Self-pay | Admitting: Family Medicine

## 2016-09-26 DIAGNOSIS — R109 Unspecified abdominal pain: Secondary | ICD-10-CM

## 2016-10-01 ENCOUNTER — Ambulatory Visit
Admission: RE | Admit: 2016-10-01 | Discharge: 2016-10-01 | Disposition: A | Discharge status: Home / Self Care | Payer: BLUE CROSS/BLUE SHIELD | Source: Ambulatory Visit | Attending: Family Medicine | Admitting: Family Medicine

## 2016-10-01 DIAGNOSIS — R109 Unspecified abdominal pain: Secondary | ICD-10-CM

## 2016-10-01 MED ORDER — IOPAMIDOL (ISOVUE-300) INJECTION 61%
100.0000 mL | Freq: Once | INTRAVENOUS | Status: AC | PRN
  Administered 2016-10-01: 100 mL via INTRAVENOUS

## 2016-10-14 ENCOUNTER — Telehealth: Payer: Self-pay | Admitting: *Deleted

## 2016-10-14 NOTE — Telephone Encounter (Signed)
Pt called c/o several issues related to possible rectal prolapse and hemorrhoids questions if she should have appointment with nancy. I advised pt to schedule office visit with GI Dr.Hung. Pt said she will call and schedule office visit with him.

## 2016-10-28 ENCOUNTER — Other Ambulatory Visit: Payer: Self-pay | Admitting: General Surgery

## 2016-10-28 NOTE — H&P (Signed)
History of Present Illness Leighton Ruff MD; [id number] 10:58 AM) Patient words: New- rectal prolapse.  The patient is a 40 year old female who presents with rectal prolapse. 40 year old female who presents to the office for evaluation of possible rectal prolapse. She has trouble with chronic diarrhea and is being treated by Dr. Benson Norway. She states that she noticed the prolapse is getting worse. She used to have episodes approximately once a month. Now she is having 2-3 episodes a week. She denies any bleeding or fecal incontinence. She does have some urinary incontinence. She complains of pelvic pressure as well.    Other Problems Malachi Bonds, CMA; 10/28/2016 10:31 AM) Anxiety Disorder Back Pain Bladder Problems Cancer Depression Gastroesophageal Reflux Disease Hemorrhoids  Past Surgical History Malachi Bonds, CMA; 10/28/2016 10:31 AM) Cesarean Section - Multiple Colon Polyp Removal - Colonoscopy Gallbladder Surgery - Laparoscopic Mammoplasty; Reduction Bilateral. Oral Surgery  Diagnostic Studies History Malachi Bonds, CMA; 10/28/2016 10:31 AM) Colonoscopy within last year Mammogram 1-3 years ago Pap Smear 1-5 years ago  Allergies Malachi Bonds, CMA; 10/28/2016 10:32 AM) No Known Drug Allergies 10/28/2016  Medication History (Chemira Jones, CMA; 10/28/2016 10:33 AM) ALPRAZolam (0.5MG  Tablet, Oral) Active. Sertraline HCl (100MG  Tablet, Oral) Active. ValACYclovir HCl (1GM Tablet, Oral) Active. Lysine (1000MG  Tablet, Oral) Active. Multivitamin Adult (Oral) Active. Zincvit (Oral) Active. L-Glutamine (500MG  Capsule, Oral) Active. Probiotic (Oral) Active. Medications Reconciled  Social History Malachi Bonds, CMA; 10/28/2016 10:31 AM) Alcohol use Occasional alcohol use. Caffeine use Coffee, Tea. Illicit drug use Prefer to discuss with provider. Tobacco use Never smoker.  Family History Malachi Bonds, CMA; 10/28/2016 10:31  AM) Alcohol Abuse Father, Sister. Breast Cancer Mother. Cancer Family Members In General. Colon Cancer Family Members In General. Colon Polyps Family Members In General, Father. Depression Father. Hypertension Father. Melanoma Father. Rectal Cancer Family Members In General. Thyroid problems Family Members In General.  Pregnancy / Birth History Malachi Bonds, [place]; 10/28/2016 10:31 AM) Age at menarche 65 years. Contraceptive History Intrauterine device, Oral contraceptives. Gravida 0 Irregular periods Para 0     Review of Systems (Chemira Jones CMA; 10/28/2016 10:31 AM) General Present- Appetite Loss, Night Sweats and Weight Loss. Not Present- Chills, Fatigue, Fever and Weight Gain. HEENT Present- Oral Ulcers and Wears glasses/contact lenses. Not Present- Earache, Hearing Loss, Hoarseness, Nose Bleed, Ringing in the Ears, Seasonal Allergies, Sinus Pain, Sore Throat, Visual Disturbances and Yellow Eyes. Respiratory Present- Snoring. Not Present- Bloody sputum, Chronic Cough, Difficulty Breathing and Wheezing. Breast Not Present- Breast Mass, Breast Pain, Nipple Discharge and Skin Changes. Cardiovascular Not Present- Chest Pain, Difficulty Breathing Lying Down, Leg Cramps, Palpitations, Rapid Heart Rate, Shortness of Breath and Swelling of Extremities. Gastrointestinal Present- Abdominal Pain, Bloating, Bloody Stool, Chronic diarrhea, Excessive gas, Gets full quickly at meals, Hemorrhoids, Nausea, Rectal Pain and Vomiting. Not Present- Change in Bowel Habits, Constipation, Difficulty Swallowing and Indigestion. Female Genitourinary Present- Frequency and Urgency. Not Present- Nocturia, Painful Urination and Pelvic Pain. Musculoskeletal Present- Back Pain. Not Present- Joint Pain, Joint Stiffness, Muscle Pain, Muscle Weakness and Swelling of Extremities. Neurological Present- Numbness and Tingling. Not Present- Decreased Memory, Fainting, Headaches, Seizures, Tremor,  Trouble walking and Weakness. Psychiatric Present- Anxiety. Not Present- Bipolar, Change in Sleep Pattern, Depression, Fearful and Frequent crying. Endocrine Not Present- Cold Intolerance, Excessive Hunger, Hair Changes, Heat Intolerance, Hot flashes and New Diabetes. Hematology Not Present- Blood Thinners, Easy Bruising, Excessive bleeding, Gland problems, HIV and Persistent Infections.  Vitals (Chemira Jones CMA; 10/28/2016 10:32 AM) 10/28/2016 10:32 AM Weight: 101.8 lb Height:  60in Body Surface Area: 1.4 m Body Mass Index: 19.88 kg/m  Temp.: 4F(Oral)  Pulse: 76 (Regular)  BP: 100/60 (Sitting, Left Arm, Standard)      Physical Exam Leighton Ruff MD; [id number] 10:45 AM)  General Mental Status-Alert. General Appearance-Not in acute distress. Build & Nutrition-Well nourished. Posture-Normal posture. Gait-Normal.  Head and Neck Head-normocephalic, atraumatic with no lesions or palpable masses. Trachea-midline.  Chest and Lung Exam Chest and lung exam reveals -on auscultation, normal breath sounds, no adventitious sounds and normal vocal resonance.  Cardiovascular Cardiovascular examination reveals -normal heart sounds, regular rate and rhythm with no murmurs and no digital clubbing, cyanosis, edema, increased warmth or tenderness.  Abdomen Inspection Inspection of the abdomen reveals - No Hernias. Palpation/Percussion Palpation and Percussion of the abdomen reveal - Soft, Non Tender, No Rigidity (guarding), No hepatosplenomegaly and No Palpable abdominal masses.  Rectal Anorectal Exam External - normal external exam. Internal - Note: Decreased sphincter tone, decreased squeeze pressure.  Neurologic Neurologic evaluation reveals -alert and oriented x 3 with no impairment of recent or remote memory, normal attention span and ability to concentrate, normal sensation and normal coordination.  Musculoskeletal Normal Exam -  Bilateral-Upper Extremity Strength Normal and Lower Extremity Strength Normal.    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 Patient also has some urinary issues which sound more like incontinence. I suggested that she see a urologist prior to surgery but she does not feel this is necessary at this time. We discussed the surgery in detail. We will plan on performing a robotic rectopexy. Risk included recurrence, nerve damage, bowel or bladder dysfunction, damage to adjacent structures, damage to ureter, change in sexual function, constipation. I believe the patient understands this and agrees to proceed. She understands that she will most likely need another procedure at some point in her life, as this has a tendency to recur with time.

## 2016-11-10 ENCOUNTER — Encounter: Payer: Self-pay | Admitting: Gastroenterology

## 2016-11-17 ENCOUNTER — Ambulatory Visit: Payer: BLUE CROSS/BLUE SHIELD | Admitting: Nurse Practitioner

## 2016-12-01 ENCOUNTER — Encounter (HOSPITAL_COMMUNITY): Payer: Self-pay | Admitting: *Deleted

## 2016-12-01 NOTE — Patient Instructions (Signed)
Monica Lawson  12/01/2016   Your procedure is scheduled on: 12/04/2016    Report to [facility] Main  Entrance take [facility]  elevators to 3rd floor to  [place] at     1050 AM.  Call this number if you have problems the morning of surgery [phone]   Remember: ONLY 1 PERSON MAY GO WITH YOU TO SHORT STAY TO GET  READY MORNING OF [place].  Do not eat food or drink liquids :After Midnight.              Drink plenty of clear liquids on 12/03/2016.       Take these medicines the morning of surgery with A SIP OF WATER: Xanax if needed, Sertraline ( Zoloft)                                You may not have any metal on your body including hair pins and              piercings  Do not wear jewelry, make-up, lotions, powders or perfumes, deodorant            Do not bring valuables to the hospital. [place].  Contacts, dentures or bridgework may not be worn into surgery.  Leave suitcase in the car. After surgery it may be brought to your room.      Special Instructions: N/A              Please read over the following fact sheets you were given: _____________________________________________________________________             [facility] - Preparing for Surgery Before surgery, you can play an important role.  Because skin is not sterile, your skin needs to be as free of germs as possible.  You can reduce the number of germs on your skin by washing with CHG (chlorahexidine gluconate) soap before surgery.  CHG is an antiseptic cleaner which kills germs and bonds with the skin to continue killing germs even after washing. Please DO NOT use if you have an allergy to CHG or antibacterial soaps.  If your skin becomes reddened/irritated stop using the CHG and inform your nurse when you arrive at Short Stay. Do not shave (including legs and underarms) for at least 48 hours prior to the first CHG shower.  You may  shave your face/neck. Please follow these instructions carefully:  1.  Shower with CHG Soap the night before surgery and the  morning of Surgery.  2.  If you choose to wash your hair, wash your hair first as usual with your  normal  shampoo.  3.  After you shampoo, rinse your hair and body thoroughly to remove the  shampoo.                           4.  Use CHG as you would any other liquid soap.  You can apply chg directly  to the skin and wash                       Gently with a scrungie or clean washcloth.  5.  Apply the CHG Soap  to your body ONLY FROM THE NECK DOWN.   Do not use on face/ open                           Wound or open sores. Avoid contact with eyes, ears mouth and genitals (private parts).                       Wash face,  Genitals (private parts) with your normal soap.             6.  Wash thoroughly, paying special attention to the area where your surgery  will be performed.  7.  Thoroughly rinse your body with warm water from the neck down.  8.  DO NOT shower/wash with your normal soap after using and rinsing off  the CHG Soap.                9.  Pat yourself dry with a clean towel.            10.  Wear clean pajamas.            11.  Place clean sheets on your bed the night of your first shower and do not  sleep with pets. Day of Surgery : Do not apply any lotions/deodorants the morning of surgery.  Please wear clean clothes to the hospital/surgery center.  FAILURE TO FOLLOW THESE INSTRUCTIONS MAY RESULT IN THE CANCELLATION OF YOUR SURGERY PATIENT SIGNATURE_________________________________  NURSE SIGNATURE__________________________________  ________________________________________________________________________    CLEAR LIQUID DIET   Foods Allowed                                                                     Foods Excluded  Coffee and tea, regular and decaf                             liquids that you cannot  Plain Jell-O in any flavor                                              see through such as: Fruit ices (not with fruit pulp)                                     milk, soups, orange juice  Iced Popsicles                                    All solid food Carbonated beverages, regular and diet                                    Cranberry, grape and apple juices Sports drinks like Gatorade Lightly seasoned clear broth or consume(fat free) Sugar, honey syrup  Sample Menu Breakfast  Lunch                                     Supper Cranberry juice                    Beef broth                            Chicken broth Jell-O                                     Grape juice                           Apple juice Coffee or tea                        Jell-O                                      Popsicle                                                Coffee or tea                        Coffee or tea  _____________________________________________________________________

## 2016-12-02 ENCOUNTER — Encounter (HOSPITAL_COMMUNITY)
Admission: RE | Admit: 2016-12-02 | Discharge: 2016-12-02 | Disposition: A | Discharge status: Home / Self Care | Payer: BLUE CROSS/BLUE SHIELD | Source: Ambulatory Visit | Attending: General Surgery | Admitting: General Surgery

## 2016-12-02 ENCOUNTER — Encounter (HOSPITAL_COMMUNITY): Payer: Self-pay

## 2016-12-02 DIAGNOSIS — K623 Rectal prolapse: Secondary | ICD-10-CM | POA: Insufficient documentation

## 2016-12-02 DIAGNOSIS — K580 Irritable bowel syndrome with diarrhea: Secondary | ICD-10-CM | POA: Insufficient documentation

## 2016-12-02 DIAGNOSIS — Z01818 Encounter for other preprocedural examination: Secondary | ICD-10-CM

## 2016-12-02 LAB — CBC
HCT: 38.8 % (ref 36.0–46.0)
HEMOGLOBIN: 13.2 g/dL (ref 12.0–15.0)
MCH: 31.4 pg (ref 26.0–34.0)
MCHC: 34 g/dL (ref 30.0–36.0)
MCV: 92.4 fL (ref 78.0–100.0)
Platelets: 246 10*3/uL (ref 150–400)
RBC: 4.2 MIL/uL (ref 3.87–5.11)
RDW: 12.5 % (ref 11.5–15.5)
WBC: 5.8 10*3/uL (ref 4.0–10.5)

## 2016-12-02 LAB — HCG, SERUM, QUALITATIVE: PREG SERUM: NEGATIVE

## 2016-12-02 NOTE — Progress Notes (Signed)
Called office of [facility] and spoke with Abigail Butts ) [place] Nurse) to clarify ? Bowel prep or not.  Patient has not yet received any instructions if there is to be a bowel prep.  Office will call patient back.  Patient aware and voiced understanding.

## 2016-12-03 LAB — HEMOGLOBIN A1C
Hgb A1c MFr Bld: 5 % (ref 4.8–5.6)
MEAN PLASMA GLUCOSE: 97 mg/dL

## 2016-12-04 ENCOUNTER — Inpatient Hospital Stay (HOSPITAL_COMMUNITY): Payer: BLUE CROSS/BLUE SHIELD | Admitting: Registered Nurse

## 2016-12-04 ENCOUNTER — Inpatient Hospital Stay (HOSPITAL_COMMUNITY)
Admission: RE | Admit: 2016-12-04 | Discharge: 2016-12-07 | DRG: 330 | Disposition: A | Discharge status: Home / Self Care | Payer: BLUE CROSS/BLUE SHIELD | Source: Ambulatory Visit | Attending: General Surgery | Admitting: General Surgery

## 2016-12-04 ENCOUNTER — Encounter (HOSPITAL_COMMUNITY)
Admission: RE | Disposition: A | Discharge status: Home / Self Care | Payer: Self-pay | Source: Ambulatory Visit | Attending: General Surgery

## 2016-12-04 ENCOUNTER — Encounter (HOSPITAL_COMMUNITY): Payer: Self-pay | Admitting: *Deleted

## 2016-12-04 DIAGNOSIS — Z79899 Other long term (current) drug therapy: Secondary | ICD-10-CM

## 2016-12-04 DIAGNOSIS — F419 Anxiety disorder, unspecified: Secondary | ICD-10-CM | POA: Diagnosis present

## 2016-12-04 DIAGNOSIS — R32 Unspecified urinary incontinence: Secondary | ICD-10-CM | POA: Diagnosis present

## 2016-12-04 DIAGNOSIS — Z85038 Personal history of other malignant neoplasm of large intestine: Secondary | ICD-10-CM | POA: Diagnosis not present

## 2016-12-04 DIAGNOSIS — F329 Major depressive disorder, single episode, unspecified: Secondary | ICD-10-CM | POA: Diagnosis present

## 2016-12-04 DIAGNOSIS — Q438 Other specified congenital malformations of intestine: Secondary | ICD-10-CM

## 2016-12-04 DIAGNOSIS — K623 Rectal prolapse: Principal | ICD-10-CM | POA: Diagnosis present

## 2016-12-04 HISTORY — PX: XI ROBOTIC ASSISTED LOWER ANTERIOR RESECTION: SHX6558

## 2016-12-04 SURGERY — RESECTION, RECTUM, LOW ANTERIOR, ROBOT-ASSISTED
Anesthesia: General | Site: Abdomen

## 2016-12-04 MED ORDER — DEXAMETHASONE SODIUM PHOSPHATE 10 MG/ML IJ SOLN
INTRAMUSCULAR | Status: DC | PRN
  Administered 2016-12-04: 10 mg via INTRAVENOUS

## 2016-12-04 MED ORDER — MIDAZOLAM HCL 2 MG/2ML IJ SOLN
INTRAMUSCULAR | Status: AC
  Filled 2016-12-04: qty 2

## 2016-12-04 MED ORDER — FENTANYL CITRATE (PF) 250 MCG/5ML IJ SOLN
INTRAMUSCULAR | Status: AC
  Filled 2016-12-04: qty 5

## 2016-12-04 MED ORDER — KETOROLAC TROMETHAMINE 15 MG/ML IJ SOLN
15.0000 mg | Freq: Four times a day (QID) | INTRAMUSCULAR | Status: DC | PRN
  Administered 2016-12-05 (×2): 15 mg via INTRAVENOUS
  Filled 2016-12-04 (×2): qty 1

## 2016-12-04 MED ORDER — BOOST / RESOURCE BREEZE PO LIQD
1.0000 | Freq: Three times a day (TID) | ORAL | Status: DC
  Administered 2016-12-04 – 2016-12-06 (×6): 1 via ORAL

## 2016-12-04 MED ORDER — DEXAMETHASONE SODIUM PHOSPHATE 10 MG/ML IJ SOLN
INTRAMUSCULAR | Status: AC
  Filled 2016-12-04: qty 1

## 2016-12-04 MED ORDER — ROCURONIUM BROMIDE 50 MG/5ML IV SOSY
PREFILLED_SYRINGE | INTRAVENOUS | Status: AC
  Filled 2016-12-04: qty 5

## 2016-12-04 MED ORDER — CELECOXIB 200 MG PO CAPS
400.0000 mg | ORAL_CAPSULE | ORAL | Status: AC
  Administered 2016-12-04: 400 mg via ORAL
  Filled 2016-12-04: qty 2

## 2016-12-04 MED ORDER — SERTRALINE HCL 50 MG PO TABS
300.0000 mg | ORAL_TABLET | Freq: Every day | ORAL | Status: DC
  Administered 2016-12-05 – 2016-12-07 (×3): 300 mg via ORAL
  Filled 2016-12-04: qty 12
  Filled 2016-12-04 (×2): qty 6

## 2016-12-04 MED ORDER — ONDANSETRON HCL 4 MG/2ML IJ SOLN
INTRAMUSCULAR | Status: DC | PRN
  Administered 2016-12-04: 4 mg via INTRAVENOUS

## 2016-12-04 MED ORDER — HEPARIN SODIUM (PORCINE) 5000 UNIT/ML IJ SOLN
5000.0000 [IU] | Freq: Once | INTRAMUSCULAR | Status: AC
  Administered 2016-12-04: 5000 [IU] via SUBCUTANEOUS
  Filled 2016-12-04: qty 1

## 2016-12-04 MED ORDER — LIDOCAINE 2% (20 MG/ML) 5 ML SYRINGE
INTRAMUSCULAR | Status: AC
  Filled 2016-12-04: qty 5

## 2016-12-04 MED ORDER — ROCURONIUM BROMIDE 10 MG/ML (PF) SYRINGE
PREFILLED_SYRINGE | INTRAVENOUS | Status: DC | PRN
  Administered 2016-12-04: 20 mg via INTRAVENOUS
  Administered 2016-12-04: 40 mg via INTRAVENOUS
  Administered 2016-12-04 (×2): 20 mg via INTRAVENOUS

## 2016-12-04 MED ORDER — ONDANSETRON HCL 4 MG PO TABS: 4.0000 mg | ORAL_TABLET | Freq: Four times a day (QID) | ORAL | Status: DC | PRN

## 2016-12-04 MED ORDER — LIDOCAINE HCL (CARDIAC) 20 MG/ML IV SOLN
INTRAVENOUS | Status: DC | PRN
  Administered 2016-12-04: 100 mg via INTRAVENOUS

## 2016-12-04 MED ORDER — PROPOFOL 10 MG/ML IV BOLUS
INTRAVENOUS | Status: DC | PRN
  Administered 2016-12-04: 60 mg via INTRAVENOUS
  Administered 2016-12-04: 140 mg via INTRAVENOUS

## 2016-12-04 MED ORDER — GLYCOPYRROLATE 0.2 MG/ML IV SOSY
PREFILLED_SYRINGE | INTRAVENOUS | Status: DC | PRN
  Administered 2016-12-04: .2 mg via INTRAVENOUS

## 2016-12-04 MED ORDER — MIDAZOLAM HCL 5 MG/5ML IJ SOLN
INTRAMUSCULAR | Status: DC | PRN
  Administered 2016-12-04: 2 mg via INTRAVENOUS

## 2016-12-04 MED ORDER — MEPERIDINE HCL 50 MG/ML IJ SOLN
INTRAMUSCULAR | Status: AC
  Filled 2016-12-04: qty 1

## 2016-12-04 MED ORDER — EPHEDRINE 5 MG/ML INJ
INTRAVENOUS | Status: AC
  Filled 2016-12-04: qty 10

## 2016-12-04 MED ORDER — PROPOFOL 10 MG/ML IV BOLUS
INTRAVENOUS | Status: AC
  Filled 2016-12-04: qty 20

## 2016-12-04 MED ORDER — BUPIVACAINE HCL (PF) 0.25 % IJ SOLN
INTRAMUSCULAR | Status: AC
  Filled 2016-12-04: qty 30

## 2016-12-04 MED ORDER — KETOROLAC TROMETHAMINE 15 MG/ML IJ SOLN
15.0000 mg | Freq: Four times a day (QID) | INTRAMUSCULAR | Status: AC
  Administered 2016-12-04 – 2016-12-05 (×3): 15 mg via INTRAVENOUS
  Filled 2016-12-04 (×3): qty 1

## 2016-12-04 MED ORDER — ALPRAZOLAM 0.5 MG PO TABS: 0.5000 mg | ORAL_TABLET | ORAL | Status: DC | PRN

## 2016-12-04 MED ORDER — MORPHINE SULFATE (PF) 2 MG/ML IV SOLN
2.0000 mg | INTRAVENOUS | Status: DC | PRN
  Administered 2016-12-04 – 2016-12-06 (×2): 2 mg via INTRAVENOUS
  Filled 2016-12-04 (×2): qty 1

## 2016-12-04 MED ORDER — DEXTROSE 5 % IV SOLN
2.0000 g | Freq: Two times a day (BID) | INTRAVENOUS | Status: AC
  Administered 2016-12-05: 2 g via INTRAVENOUS
  Filled 2016-12-04: qty 2

## 2016-12-04 MED ORDER — EPHEDRINE SULFATE-NACL 50-0.9 MG/10ML-% IV SOSY
PREFILLED_SYRINGE | INTRAVENOUS | Status: DC | PRN
  Administered 2016-12-04: 10 mg via INTRAVENOUS

## 2016-12-04 MED ORDER — ENOXAPARIN SODIUM 40 MG/0.4ML ~~LOC~~ SOLN
40.0000 mg | SUBCUTANEOUS | Status: DC
  Administered 2016-12-05 – 2016-12-07 (×3): 40 mg via SUBCUTANEOUS
  Filled 2016-12-04 (×3): qty 0.4

## 2016-12-04 MED ORDER — LACTATED RINGERS IV SOLN
INTRAVENOUS | Status: DC
  Administered 2016-12-04 (×3): via INTRAVENOUS

## 2016-12-04 MED ORDER — LACTATED RINGERS IR SOLN
Status: DC | PRN
  Administered 2016-12-04: 3000 mL

## 2016-12-04 MED ORDER — PROMETHAZINE HCL 25 MG/ML IJ SOLN: 6.2500 mg | INTRAMUSCULAR | Status: DC | PRN

## 2016-12-04 MED ORDER — ONDANSETRON HCL 4 MG/2ML IJ SOLN
INTRAMUSCULAR | Status: AC
  Filled 2016-12-04: qty 2

## 2016-12-04 MED ORDER — CEFOTETAN DISODIUM-DEXTROSE 2-2.08 GM-% IV SOLR
2.0000 g | Freq: Once | INTRAVENOUS | Status: AC
  Administered 2016-12-04: 2 g via INTRAVENOUS

## 2016-12-04 MED ORDER — SUGAMMADEX SODIUM 200 MG/2ML IV SOLN
INTRAVENOUS | Status: DC | PRN
  Administered 2016-12-04: 200 mg via INTRAVENOUS

## 2016-12-04 MED ORDER — 0.9 % SODIUM CHLORIDE (POUR BTL) OPTIME
TOPICAL | Status: DC | PRN
  Administered 2016-12-04: 2000 mL

## 2016-12-04 MED ORDER — HYDROMORPHONE HCL 2 MG/ML IJ SOLN
INTRAMUSCULAR | Status: AC
  Filled 2016-12-04: qty 1

## 2016-12-04 MED ORDER — HYDROMORPHONE HCL 1 MG/ML IJ SOLN: 0.2500 mg | INTRAMUSCULAR | Status: DC | PRN

## 2016-12-04 MED ORDER — BUPIVACAINE HCL (PF) 0.25 % IJ SOLN
INTRAMUSCULAR | Status: DC | PRN
  Administered 2016-12-04: 30 mL

## 2016-12-04 MED ORDER — SUCCINYLCHOLINE CHLORIDE 200 MG/10ML IV SOSY
PREFILLED_SYRINGE | INTRAVENOUS | Status: AC
  Filled 2016-12-04: qty 10

## 2016-12-04 MED ORDER — ACETAMINOPHEN 500 MG PO TABS
1000.0000 mg | ORAL_TABLET | Freq: Four times a day (QID) | ORAL | Status: AC
  Administered 2016-12-04 – 2016-12-05 (×4): 1000 mg via ORAL
  Filled 2016-12-04 (×4): qty 2

## 2016-12-04 MED ORDER — HYDROMORPHONE HCL 1 MG/ML IJ SOLN
INTRAMUSCULAR | Status: DC | PRN
  Administered 2016-12-04 (×2): 1 mg via INTRAVENOUS

## 2016-12-04 MED ORDER — GABAPENTIN 300 MG PO CAPS
300.0000 mg | ORAL_CAPSULE | ORAL | Status: AC
  Administered 2016-12-04: 300 mg via ORAL
  Filled 2016-12-04: qty 1

## 2016-12-04 MED ORDER — ACETAMINOPHEN 500 MG PO TABS
1000.0000 mg | ORAL_TABLET | ORAL | Status: AC
  Administered 2016-12-04: 1000 mg via ORAL
  Filled 2016-12-04: qty 2

## 2016-12-04 MED ORDER — GLYCOPYRROLATE 0.2 MG/ML IV SOSY
PREFILLED_SYRINGE | INTRAVENOUS | Status: AC
  Filled 2016-12-04: qty 3

## 2016-12-04 MED ORDER — MEPERIDINE HCL 50 MG/ML IJ SOLN
6.2500 mg | INTRAMUSCULAR | Status: DC | PRN
  Administered 2016-12-04: 12.5 mg via INTRAVENOUS

## 2016-12-04 MED ORDER — ONDANSETRON HCL 4 MG/2ML IJ SOLN
4.0000 mg | Freq: Four times a day (QID) | INTRAMUSCULAR | Status: DC | PRN
  Administered 2016-12-05: 4 mg via INTRAVENOUS
  Filled 2016-12-04: qty 2

## 2016-12-04 MED ORDER — FENTANYL CITRATE (PF) 100 MCG/2ML IJ SOLN
INTRAMUSCULAR | Status: DC | PRN
  Administered 2016-12-04: 100 ug via INTRAVENOUS
  Administered 2016-12-04 (×3): 50 ug via INTRAVENOUS
  Administered 2016-12-04: 100 ug via INTRAVENOUS
  Administered 2016-12-04: 50 ug via INTRAVENOUS
  Administered 2016-12-04: 100 ug via INTRAVENOUS

## 2016-12-04 MED ORDER — CEFOTETAN DISODIUM-DEXTROSE 2-2.08 GM-% IV SOLR
INTRAVENOUS | Status: AC
  Filled 2016-12-04: qty 50

## 2016-12-04 SURGICAL SUPPLY — 106 items
ADH SKN CLS APL DERMABOND .7 (GAUZE/BANDAGES/DRESSINGS) ×1
BLADE EXTENDED COATED 6.5IN (ELECTRODE) IMPLANT
CANNULA REDUC XI 12-8 STAPL (CANNULA) ×1
CANNULA REDUC XI 12-8MM STAPL (CANNULA) ×1
CANNULA REDUCER 12-8 DVNC XI (CANNULA) ×1 IMPLANT
CELLS DAT CNTRL 66122 CELL SVR (MISCELLANEOUS) IMPLANT
CLIP LIGATING HEM O LOK PURPLE (MISCELLANEOUS) IMPLANT
CLIP LIGATING HEMOLOK MED (MISCELLANEOUS) IMPLANT
COUNTER NEEDLE 20 DBL MAG RED (NEEDLE) ×1 IMPLANT
COVER MAYO STAND STRL (DRAPES) ×2 IMPLANT
COVER TIP SHEARS 8 DVNC (MISCELLANEOUS) ×1 IMPLANT
COVER TIP SHEARS 8MM DA VINCI (MISCELLANEOUS) ×2
DECANTER SPIKE VIAL GLASS SM (MISCELLANEOUS) ×1 IMPLANT
DERMABOND ADVANCED (GAUZE/BANDAGES/DRESSINGS) ×2
DERMABOND ADVANCED .7 DNX12 (GAUZE/BANDAGES/DRESSINGS) IMPLANT
DEVICE TROCAR PUNCTURE CLOSURE (ENDOMECHANICALS) IMPLANT
DRAIN CHANNEL 19F RND (DRAIN) ×2 IMPLANT
DRAPE ARM DVNC X/XI (DISPOSABLE) ×4 IMPLANT
DRAPE COLUMN DVNC XI (DISPOSABLE) ×1 IMPLANT
DRAPE DA VINCI XI ARM (DISPOSABLE) ×8
DRAPE DA VINCI XI COLUMN (DISPOSABLE) ×2
DRAPE SURG IRRIG POUCH 19X23 (DRAPES) ×3 IMPLANT
DRSG OPSITE POSTOP 4X10 (GAUZE/BANDAGES/DRESSINGS) IMPLANT
DRSG OPSITE POSTOP 4X6 (GAUZE/BANDAGES/DRESSINGS) IMPLANT
DRSG OPSITE POSTOP 4X8 (GAUZE/BANDAGES/DRESSINGS) IMPLANT
DRSG TEGADERM 4X4.75 (GAUZE/BANDAGES/DRESSINGS) ×2 IMPLANT
ELECT PENCIL ROCKER SW 15FT (MISCELLANEOUS) ×2 IMPLANT
ELECT REM PT RETURN 15FT ADLT (MISCELLANEOUS) ×3 IMPLANT
ENDOLOOP SUT PDS II  0 18 (SUTURE)
ENDOLOOP SUT PDS II 0 18 (SUTURE) IMPLANT
EVACUATOR SILICONE 100CC (DRAIN) ×2 IMPLANT
GAUZE SPONGE 4X4 12PLY STRL (GAUZE/BANDAGES/DRESSINGS) IMPLANT
GLOVE BIO SURGEON STRL SZ 6.5 (GLOVE) ×6 IMPLANT
GLOVE BIO SURGEONS STRL SZ 6.5 (GLOVE) ×3
GLOVE BIOGEL PI IND STRL 7.0 (GLOVE) ×3 IMPLANT
GLOVE BIOGEL PI INDICATOR 7.0 (GLOVE) ×6
GOWN STRL REUS W/TWL 2XL LVL3 (GOWN DISPOSABLE) ×9 IMPLANT
GOWN STRL REUS W/TWL XL LVL3 (GOWN DISPOSABLE) ×12 IMPLANT
GRASPER ENDOPATH ANVIL 10MM (MISCELLANEOUS) IMPLANT
HOLDER FOLEY CATH W/STRAP (MISCELLANEOUS) ×5 IMPLANT
IRRIG SUCT STRYKERFLOW 2 WTIP (MISCELLANEOUS)
IRRIGATION SUCT STRKRFLW 2 WTP (MISCELLANEOUS) ×1 IMPLANT
KIT PROCEDURE DA VINCI SI (MISCELLANEOUS)
KIT PROCEDURE DVNC SI (MISCELLANEOUS) ×1 IMPLANT
LEGGING LITHOTOMY PAIR STRL (DRAPES) ×3 IMPLANT
MARKER SKIN DUAL TIP RULER LAB (MISCELLANEOUS) ×2 IMPLANT
NDL INSUFFLATION 14GA 120MM (NEEDLE) ×1 IMPLANT
NEEDLE INSUFFLATION 14GA 120MM (NEEDLE) ×3 IMPLANT
PACK CARDIOVASCULAR III (CUSTOM PROCEDURE TRAY) ×3 IMPLANT
PACK COLON (CUSTOM PROCEDURE TRAY) ×1 IMPLANT
PACK LAPAROSCOPY W LONG (CUSTOM PROCEDURE TRAY) ×2 IMPLANT
PAD POSITIONING PINK XL (MISCELLANEOUS) ×2 IMPLANT
PORT LAP GEL ALEXIS MED 5-9CM (MISCELLANEOUS) IMPLANT
RELOAD STAPLE 45 BLU REG DVNC (STAPLE) IMPLANT
RELOAD STAPLE 45 GRN THCK DVNC (STAPLE) IMPLANT
RETRACTOR WND ALEXIS 18 MED (MISCELLANEOUS) IMPLANT
RTRCTR WOUND ALEXIS 18CM MED (MISCELLANEOUS)
SCISSORS LAP 5X35 DISP (ENDOMECHANICALS) ×3 IMPLANT
SEAL CANN UNIV 5-8 DVNC XI (MISCELLANEOUS) ×3 IMPLANT
SEAL XI 5MM-8MM UNIVERSAL (MISCELLANEOUS) ×8
SEALER VESSEL DA VINCI XI (MISCELLANEOUS) ×2
SEALER VESSEL EXT DVNC XI (MISCELLANEOUS) ×1 IMPLANT
SET BI-LUMEN FLTR TB AIRSEAL (TUBING) ×3 IMPLANT
SET IRRIG TUBING LAPAROSCOPIC (IRRIGATION / IRRIGATOR) ×2 IMPLANT
SLEEVE ADV FIXATION 5X100MM (TROCAR) IMPLANT
SOLUTION ELECTROLUBE (MISCELLANEOUS) ×3 IMPLANT
SPONGE DRAIN TRACH 4X4 STRL 2S (GAUZE/BANDAGES/DRESSINGS) ×2 IMPLANT
STAPLER 45 BLU RELOAD XI (STAPLE) IMPLANT
STAPLER 45 BLUE RELOAD XI (STAPLE)
STAPLER 45 GREEN RELOAD XI (STAPLE)
STAPLER 45 GRN RELOAD XI (STAPLE) IMPLANT
STAPLER CANNULA SEAL DVNC XI (STAPLE) ×1 IMPLANT
STAPLER CANNULA SEAL XI (STAPLE)
STAPLER SHEATH (SHEATH)
STAPLER SHEATH ENDOWRIST DVNC (SHEATH) ×1 IMPLANT
STAPLER VISISTAT 35W (STAPLE) ×3 IMPLANT
SUT ETHIBOND 0 36 GRN (SUTURE) ×4 IMPLANT
SUT ETHILON 2 0 PS N (SUTURE) IMPLANT
SUT ETHILON 3 0 PS 1 (SUTURE) ×2 IMPLANT
SUT NOVA NAB GS-21 0 18 T12 DT (SUTURE) ×6 IMPLANT
SUT PDS AB 1 CTX 36 (SUTURE) IMPLANT
SUT PDS AB 1 TP1 96 (SUTURE) IMPLANT
SUT PROLENE 2 0 KS (SUTURE) ×3 IMPLANT
SUT SILK 2 0 (SUTURE)
SUT SILK 2 0 SH CR/8 (SUTURE) ×1 IMPLANT
SUT SILK 2-0 18XBRD TIE 12 (SUTURE) ×1 IMPLANT
SUT SILK 3 0 (SUTURE)
SUT SILK 3 0 SH CR/8 (SUTURE) ×1 IMPLANT
SUT SILK 3-0 18XBRD TIE 12 (SUTURE) ×1 IMPLANT
SUT V-LOC BARB 180 2/0GR6 GS22 (SUTURE) ×3
SUT VIC AB 2-0 SH 18 (SUTURE) ×1 IMPLANT
SUT VIC AB 2-0 SH 27 (SUTURE)
SUT VIC AB 2-0 SH 27X BRD (SUTURE) ×1 IMPLANT
SUT VIC AB 3-0 SH 18 (SUTURE) IMPLANT
SUT VIC AB 4-0 PS2 27 (SUTURE) ×6 IMPLANT
SUTURE V-LC BRB 180 2/0GR6GS22 (SUTURE) IMPLANT
SYR 10ML LL (SYRINGE) ×3 IMPLANT
SYS LAPSCP GELPORT 120MM (MISCELLANEOUS)
SYSTEM LAPSCP GELPORT 120MM (MISCELLANEOUS) IMPLANT
TOWEL OR 17X26 10 PK STRL BLUE (TOWEL DISPOSABLE) ×3 IMPLANT
TOWEL OR NON WOVEN STRL DISP B (DISPOSABLE) ×3 IMPLANT
TRAY FOLEY CATH 14FRSI W/METER (CATHETERS) ×2 IMPLANT
TRAY FOLEY W/METER SILVER 16FR (SET/KITS/TRAYS/PACK) ×1 IMPLANT
TROCAR ADV FIXATION 5X100MM (TROCAR) ×3 IMPLANT
TUBING CONNECTING 10 (TUBING) IMPLANT
TUBING CONNECTING 10' (TUBING)

## 2016-12-04 NOTE — Op Note (Signed)
12/04/2016  4:00 PM  PATIENT:  Monica Lawson  40 y.o. female  Patient Care Team: Marda Stalker, PA-C as PCP - General (Family Medicine) Danie Binder, MD as Consulting Physician (Gastroenterology)  PRE-OPERATIVE DIAGNOSIS:  rectal prolapse  POST-OPERATIVE DIAGNOSIS:  rectal prolapse  PROCEDURE:  XI ROBOTIC RECTOPEXY    Surgeon(s): Leighton Ruff, MD Stark Klein, MD  ASSISTANT: Dr Barry Dienes   ANESTHESIA:   local and general  EBL: 50 ml  Total I/O In: 1000 [I.V.:1000] Out: 80 [Urine:30; Blood:50]  Delay start of Pharmacological VTE agent (>24hrs) due to surgical blood loss or risk of bleeding:  no  DRAINS: (19 F ) Jackson-Pratt drain(s) with closed bulb suction in the pelvis   SPECIMEN:  none  DISPOSITION OF SPECIMEN:  PATHOLOGY  COUNTS:  YES  PLAN OF CARE: Admit to inpatient   PATIENT DISPOSITION:  PACU - hemodynamically stable.  INDICATION:    40 y.o. F with chronic diarrhea and rectal prolapse.  I recommended segmental resection:  The anatomy & physiology of the digestive tract was discussed.  The pathophysiology was discussed.  Natural history risks without surgery was discussed.   I worked to give an overview of the disease and the frequent need to have multispecialty involvement.  I feel the risks of no intervention will lead to serious problems that outweigh the operative risks; therefore, I recommended a partial colectomy to remove the pathology.  Laparoscopic & open techniques were discussed.   Risks such as bleeding, infection, abscess, leak, reoperation, possible ostomy, hernia, heart attack, death, and other risks were discussed.  I noted a good likelihood this will help address the problem.   Goals of post-operative recovery were discussed as well.    The patient expressed understanding & wished to proceed with surgery.  OR FINDINGS:   Rectal prolapse, redundant sigmoid  DESCRIPTION:   Informed consent was confirmed.  The patient underwent general  anaesthesia without difficulty.  The patient was positioned appropriately.  VTE prevention in place.  The patient's abdomen was clipped, prepped, & draped in a sterile fashion.  Surgical timeout confirmed our plan.  The patient was positioned in reverse Trendelenburg.  Abdominal entry was gained using a Varies needle in the LUQ.  Entry was clean.  I induced carbon dioxide insufflation.  I placed an 59mm port in the RUQ.  Camera inspection revealed no injury.  Extra ports were carefully placed under direct laparoscopic visualization.   I reflected the greater omentum and the upper abdomen the small bowel in the upper abdomen. I scored the base of peritoneum of the right side of the mesentery of the left colon from the ligament of Treitz to the peritoneal reflection of the mid rectum.  The patient had redundant sigmoid colon.  I elevated the sigmoid mesentery and enetered into the retro-mesenteric plane. We were able to identify the left ureter and gonadal vessels. We kept those posterior within the retroperitoneum and elevated the left colon mesentery off that.  I continued distally and got into the avascular plane posterior to the mesorectum. This allowed me to help mobilize the rectum as well by freeing the mesorectum off the sacrum.  I mobilized the peritoneal coverings towards the peritoneal reflection on both the right and left sides of the rectum.  I could see the right and left ureters and stayed away from them. I mobilized the anterior peritoneal reflection and separated the distal vagina from the anterior rectal wall using blunt dissection. I did not continue down in  this plane for more than 2-3 cm to avoid any damage to the structures until allow for the vagina to be pulled up with the rectum.  I continued mobilizing the posterior mesorectum down to the level of the pelvic floor. I continued out laterally freeing off the attachments bluntly. I did not divide the lateral stalks completely.  After this I  was able to mobilize the anterior peritoneal reflection to the level of the sacrum. I used a 20V lock suture to pull the peritoneal reflection to the sacrum. I then placed 0 Ethibond sutures 4, 2 on each side to the mid sacrum and attached this to the anterior peritoneal reflection. At the end of these 4 sutures, there was good tension on the rectum. I used the V lock suture to close the peritoneum loosely. I placed a 72 [place] Blake drain in the pelvis and brought this out through the right lower quadrant port site.  The robot was undocked.  The abdomen was then irrigated and evaluated. Hemostasis was good. All 4 quadrants of the abdomen more inspected. There was no further injuries noted.  The drain was secured with a 2-0 nylon suture. The abdomen was desufflated and the remaining port sites were closed using 4-0 Vicryl suture and skin glue. The patient tolerated procedure well was sent to the postanesthesia care unit in stable condition. All counts were correct per operating room staff.

## 2016-12-04 NOTE — Anesthesia Preprocedure Evaluation (Addendum)
Anesthesia Evaluation  Patient identified by MRN, date of birth, ID band Patient awake    History of Anesthesia Complications Negative for: history of anesthetic complications  Airway Mallampati: I  TM Distance: >3 FB Neck ROM: Full    Dental  (+) Teeth Intact, Dental Advisory Given   Pulmonary neg pulmonary ROS,    breath sounds clear to auscultation       Cardiovascular  Rhythm:Regular Rate:Normal     Neuro/Psych negative neurological ROS     GI/Hepatic negative GI ROS, Neg liver ROS, Hx of Colon ca    Endo/Other  negative endocrine ROS  Renal/GU negative Renal ROS     Musculoskeletal negative musculoskeletal ROS (+)   Abdominal   Peds  Hematology negative hematology ROS (+)   Anesthesia Other Findings   Reproductive/Obstetrics                             Anesthesia Physical Anesthesia Plan  ASA: I  Anesthesia Plan: General   Post-op Pain Management:    Induction: Intravenous  Airway Management Planned: Oral ETT  Additional Equipment:   Intra-op Plan:   Post-operative Plan: Extubation in OR  Informed Consent: I have reviewed the patients History and Physical, chart, labs and discussed the procedure including the risks, benefits and alternatives for the proposed anesthesia with the patient or authorized representative who has indicated his/her understanding and acceptance.   Dental advisory given  Plan Discussed with: CRNA  Anesthesia Plan Comments:         Anesthesia Quick Evaluation

## 2016-12-04 NOTE — Anesthesia Procedure Notes (Signed)
Procedure Name: Intubation Date/Time: 12/04/2016 1:30 PM Performed by: Carleene Cooper A Pre-anesthesia Checklist: Patient identified, Timeout performed, Emergency Drugs available, Patient being monitored and Suction available Patient Re-evaluated:Patient Re-evaluated prior to inductionOxygen Delivery Method: Circle system utilized Preoxygenation: Pre-oxygenation with 100% oxygen Intubation Type: IV induction Ventilation: Mask ventilation without difficulty Laryngoscope Size: Mac and 3 Grade View: Grade I Tube type: Oral Tube size: 7.0 mm Number of attempts: 1 Airway Equipment and Method: Stylet Placement Confirmation: ETT inserted through vocal cords under direct vision,  positive ETCO2 and breath sounds checked- equal and bilateral Secured at: 21 cm Tube secured with: Tape Dental Injury: Teeth and Oropharynx as per pre-operative assessment

## 2016-12-04 NOTE — H&P (Signed)
The patient is a 40 year old female who presents with rectal prolapse. 40 year old female who presents to the office for evaluation of possible rectal prolapse. She has trouble with chronic diarrhea and is being treated by Dr. Benson Norway. She states that she noticed the prolapse is getting worse. She used to have episodes approximately once a month. Now she is having 2-3 episodes a week. She denies any bleeding or fecal incontinence. She does have some urinary incontinence. She complains of pelvic pressure as well.    Other Problems  Anxiety Disorder Back Pain Bladder Problems Cancer Depression Gastroesophageal Reflux Disease Hemorrhoids  Past Surgical History  Cesarean Section - Multiple Colon Polyp Removal - Colonoscopy Gallbladder Surgery - Laparoscopic Mammoplasty; Reduction Bilateral. Oral Surgery  Diagnostic Studies History  Colonoscopy within last year Mammogram 1-3 years ago Pap Smear 1-5 years ago  Allergies No Known Drug Allergies 10/28/2016  Medication History  ALPRAZolam (0.5MG  Tablet, Oral) Active. Sertraline HCl (100MG  Tablet, Oral) Active. ValACYclovir HCl (1GM Tablet, Oral) Active. Lysine (1000MG  Tablet, Oral) Active. Multivitamin Adult (Oral) Active. Zincvit (Oral) Active. L-Glutamine (500MG  Capsule, Oral) Active. Probiotic (Oral) Active. Medications Reconciled  Social History Malachi Bonds, CMA; 10/28/2016 10:31 AM) Alcohol use Occasional alcohol use. Caffeine use Coffee, Tea. Illicit drug use Prefer to discuss with provider. Tobacco use Never smoker.  Family History Malachi Bonds, CMA; 10/28/2016 10:31 AM) Alcohol Abuse Father, Sister. Breast Cancer Mother. Cancer Family Members In General. Colon Cancer Family Members In General. Colon Polyps Family Members In General, Father. Depression Father. Hypertension Father. Melanoma Father. Rectal Cancer Family Members In General. Thyroid problems  Family Members In General.  Pregnancy / Birth History Malachi Bonds, [place]; 10/28/2016 10:31 AM) Age at menarche 38 years. Contraceptive History Intrauterine device, Oral contraceptives. Gravida 0 Irregular periods Para 0     Review of Systems (Chemira Jones CMA; 10/28/2016 10:31 AM) General Present- Appetite Loss, Night Sweats and Weight Loss. Not Present- Chills, Fatigue, Fever and Weight Gain. HEENT Present- Oral Ulcers and Wears glasses/contact lenses. Not Present- Earache, Hearing Loss, Hoarseness, Nose Bleed, Ringing in the Ears, Seasonal Allergies, Sinus Pain, Sore Throat, Visual Disturbances and Yellow Eyes. Respiratory Present- Snoring. Not Present- Bloody sputum, Chronic Cough, Difficulty Breathing and Wheezing. Breast Not Present- Breast Mass, Breast Pain, Nipple Discharge and Skin Changes. Cardiovascular Not Present- Chest Pain, Difficulty Breathing Lying Down, Leg Cramps, Palpitations, Rapid Heart Rate, Shortness of Breath and Swelling of Extremities. Gastrointestinal Present- Abdominal Pain, Bloating, Bloody Stool, Chronic diarrhea, Excessive gas, Gets full quickly at meals, Hemorrhoids, Nausea, Rectal Pain and Vomiting. Not Present- Change in Bowel Habits, Constipation, Difficulty Swallowing and Indigestion. Female Genitourinary Present- Frequency and Urgency. Not Present- Nocturia, Painful Urination and Pelvic Pain. Musculoskeletal Present- Back Pain. Not Present- Joint Pain, Joint Stiffness, Muscle Pain, Muscle Weakness and Swelling of Extremities. Neurological Present- Numbness and Tingling. Not Present- Decreased Memory, Fainting, Headaches, Seizures, Tremor, Trouble walking and Weakness. Psychiatric Present- Anxiety. Not Present- Bipolar, Change in Sleep Pattern, Depression, Fearful and Frequent crying. Endocrine Not Present- Cold Intolerance, Excessive Hunger, Hair Changes, Heat Intolerance, Hot flashes and New Diabetes. Hematology Not Present- Blood Thinners,  Easy Bruising, Excessive bleeding, Gland problems, HIV and Persistent Infections.  Ht 5\' 1"  (1.549 m)   Wt 46.3 kg (102 lb)   BMI 19.27 kg/m     Physical Exam   General Mental Status-Alert. General Appearance-Not in acute distress. Build & Nutrition-Well nourished. Posture-Normal posture. Gait-Normal.  Head and Neck Head-normocephalic, atraumatic with no lesions or palpable masses. Trachea-midline.  Chest and  Lung Exam Chest and lung exam reveals -on auscultation, normal breath sounds, no adventitious sounds and normal vocal resonance.  Cardiovascular Cardiovascular examination reveals -normal heart sounds, regular rate and rhythm with no murmurs and no digital clubbing, cyanosis, edema, increased warmth or tenderness.  Abdomen Inspection Inspection of the abdomen reveals - No Hernias. Palpation/Percussion Palpation and Percussion of the abdomen reveal - Soft, Non Tender, No Rigidity (guarding), No hepatosplenomegaly and No Palpable abdominal masses.  Rectal Anorectal Exam External - normal external exam. Internal - Note: Decreased sphincter tone, decreased squeeze pressure.  Neurologic Neurologic evaluation reveals -alert and oriented x 3 with no impairment of recent or remote memory, normal attention span and ability to concentrate, normal sensation and normal coordination.  Musculoskeletal Normal Exam - Bilateral-Upper Extremity Strength Normal and Lower Extremity Strength Normal.    Assessment & Plan   COMPLETE RECTAL PROLAPSE WITH NO DISPLACEMENT OF ANAL MUSCLES (K62.3) Impression: 40 year old female with rectal prolapse. Patient also has some urinary issues which sound more like incontinence. I suggested that she see a urologist prior to surgery but she does not feel this is necessary at this time. We discussed the surgery in detail. We will plan on performing a robotic rectopexy. Risk included recurrence, nerve damage, bowel  or bladder dysfunction, damage to adjacent structures, damage to ureter, change in sexual function, constipation. I believe the patient understands this and agrees to proceed. She understands that she will most likely need another procedure at some point in her life, as this has a tendency to recur with time.

## 2016-12-04 NOTE — Transfer of Care (Signed)
Immediate Anesthesia Transfer of Care Note  Patient: Monica Lawson  Procedure(s) Performed: Procedure(s): XI ROBOTIC RECTOPEXY (N/A)  Patient Location: PACU  Anesthesia Type:General  Level of Consciousness: awake, alert , oriented and patient cooperative  Airway & Oxygen Therapy: Patient Spontanous Breathing and Patient connected to face mask oxygen  Post-op Assessment: Post -op Vital signs reviewed and stable and Patient moving all extremities X 4  Post vital signs: stable  Last Vitals:  Vitals:   12/04/16 1621  Temp: 36.4 C    Last Pain: There were no vitals filed for this visit.       Complications: No apparent anesthesia complications

## 2016-12-04 NOTE — Anesthesia Postprocedure Evaluation (Signed)
Anesthesia Post Note  Patient: Monica Lawson  Procedure(s) Performed: Procedure(s) (LRB): XI ROBOTIC RECTOPEXY (N/A)  Patient location during evaluation: PACU Anesthesia Type: General Level of consciousness: awake and alert Pain management: pain level controlled Vital Signs Assessment: post-procedure vital signs reviewed and stable Respiratory status: spontaneous breathing, nonlabored ventilation, respiratory function stable and patient connected to nasal cannula oxygen Cardiovascular status: blood pressure returned to baseline and stable Postop Assessment: no signs of nausea or vomiting Anesthetic complications: no    Last Vitals:  Vitals:   12/04/16 2005 12/04/16 2105  BP: 98/84 115/85  Pulse: 81 60  Resp: 16 16  Temp: 37.2 C 36.7 C    Last Pain:  Vitals:   12/04/16 2210  TempSrc:   PainSc: 6                  Monica Lawson,JAMES TERRILL

## 2016-12-05 ENCOUNTER — Encounter (HOSPITAL_COMMUNITY): Payer: Self-pay | Admitting: General Surgery

## 2016-12-05 LAB — BASIC METABOLIC PANEL
Anion gap: 3 — ABNORMAL LOW (ref 5–15)
BUN: 8 mg/dL (ref 6–20)
CALCIUM: 8 mg/dL — AB (ref 8.9–10.3)
CO2: 29 mmol/L (ref 22–32)
Chloride: 106 mmol/L (ref 101–111)
Creatinine, Ser: 0.76 mg/dL (ref 0.44–1.00)
GFR calc Af Amer: >60 mL/min (ref >60)
GLUCOSE: 125 mg/dL — AB (ref 65–99)
Potassium: 3.9 mmol/L (ref 3.5–5.1)
Sodium: 138 mmol/L (ref 135–145)

## 2016-12-05 LAB — CBC
HCT: 30.5 % — ABNORMAL LOW (ref 36.0–46.0)
Hemoglobin: 10.3 g/dL — ABNORMAL LOW (ref 12.0–15.0)
MCH: 31.7 pg (ref 26.0–34.0)
MCHC: 33.8 g/dL (ref 30.0–36.0)
MCV: 93.8 fL (ref 78.0–100.0)
Platelets: 219 10*3/uL (ref 150–400)
RBC: 3.25 MIL/uL — ABNORMAL LOW (ref 3.87–5.11)
RDW: 12.6 % (ref 11.5–15.5)
WBC: 7.5 10*3/uL (ref 4.0–10.5)

## 2016-12-05 MED ORDER — HYDROCODONE-ACETAMINOPHEN 5-325 MG PO TABS
1.0000 | ORAL_TABLET | ORAL | Status: DC | PRN
  Administered 2016-12-05: 1 via ORAL
  Administered 2016-12-05 – 2016-12-07 (×5): 2 via ORAL
  Filled 2016-12-05: qty 2
  Filled 2016-12-05: qty 1
  Filled 2016-12-05 (×4): qty 2

## 2016-12-05 NOTE — Progress Notes (Signed)
1 Day Post-Op robotic rectopexy Subjective: Did well overnight, pain controlled, passing flatus  Objective: Vital signs in last 24 hours: Temp:  [97.6 F (36.4 C)-99.1 F (37.3 C)] 98.7 F (37.1 C) (12/08 1000) Pulse Rate:  [54-92] 60 (12/08 1000) Resp:  [14-24] 16 (12/08 1000) BP: (90-133)/(40-85) 101/59 (12/08 1000) SpO2:  [96 %-100 %] 98 % (12/08 1000) Weight:  [46.3 kg (102 lb)] 46.3 kg (102 lb) (12/07 1814)   Intake/Output from previous day: 12/07 0701 - 12/08 0700 In: 3832.5 [P.O.:570; I.V.:3212.5; IV Piggyback:50] Out: 1300 [Urine:835; Drains:415; Blood:50] Intake/Output this shift: Total I/O In: [id number] [P.O.:912] Out: 300 [Urine:300]   General appearance: alert and cooperative Abd: soft, non-distended. JP: SS drainage Incision: no significant drainage  Lab Results:   Recent Labs  12/02/16 1325 12/05/16 0422  WBC 5.8 7.5  HGB 13.2 10.3*  HCT 38.8 30.5*  PLT 246 219   BMET  Recent Labs  12/05/16 0422  NA 138  K 3.9  CL 106  CO2 29  GLUCOSE 125*  BUN 8  CREATININE 0.76  CALCIUM 8.0*   PT/INR No results for input(s): LABPROT, INR in the last 72 hours. ABG No results for input(s): PHART, HCO3 in the last 72 hours.  Invalid input(s): PCO2, PO2  MEDS, Scheduled . acetaminophen  1,000 mg Oral Q6H  . enoxaparin (LOVENOX) injection  40 mg Subcutaneous Q24H  . feeding supplement  1 Container Oral TID BM  . sertraline  300 mg Oral Daily    Studies/Results: No results found.  Assessment: s/p Procedure(s): XI ROBOTIC RECTOPEXY Patient Active Problem List   Diagnosis Date Noted  . Rectal prolapse 12/04/2016  . Colon cancer ([place]) 02/09/2015  . Anxiety and depression 02/08/2014  . Ovarian cyst, right 02/04/2013  . CIN I (cervical intraepithelial neoplasia I)     Expected post op course  Plan: d/c foley  Advance to reg diet PO pain meds Ambulate Poss d/c in AM   LOS: 1 day     .Rosario Adie, [facility],  [place]   12/05/2016 10:53 AM

## 2016-12-05 NOTE — Progress Notes (Signed)
PT Cancellation Note  Patient Details Name: Monica Lawson MRN: [id number] DOB: 12-01-1976   Cancelled Treatment:    Reason Eval/Treat Not Completed: PT screened, no needs identified, will sign off   [facility] 12/05/2016, 11:25 AM

## 2016-12-05 NOTE — Progress Notes (Signed)
Initial Nutrition Assessment  DOCUMENTATION CODES:   Not applicable  INTERVENTION:  Continue Boost Breeze po TID, each supplement provides 250 kcal and 9 grams of protein. Ensure contains soy protein isolate.  Discussed importance of adequate calories and protein through meals, snacks, and beverages.  Patient likely to benefit from outpatient nutrition education/counseling in setting of multiple food allergies affecting dietary intake and leading to weight loss.  NUTRITION DIAGNOSIS:   Unintentional weight loss related to poor appetite, other (see comment) (new diagnosis of multiple food allergies) as evidenced by 9 percent weight loss over unspecified time period, per patient/family report.  GOAL:   Patient will meet greater than or equal to 90% of their needs  MONITOR:   PO intake, Supplement acceptance, Labs, Weight trends, I & O's  REASON FOR ASSESSMENT:   Malnutrition Screening Tool    ASSESSMENT:   40 year old female presents with rectal prolapse. She also has trouble with chronic diarrhea. Patient now s/p Robotic Rectopexy on 12/7.   Spoke with patient at bedside. She reports poor appetite for several months now. She reports having 1-2 meals per day since she has had to cut out many foods due to newly diagnosed food allergies. Typical intake is chicken, bananas, sun butter, apples, and eggs. Patient denies N/V or abdominal pain. She is sore from the procedure. Has had flatus but no bowel movement yet.  Patient could not recall all allergies. Per chart she is allergic to corn-containing products, lactose, [place], blueberries, watermelon, cabbage, avocado, mushrooms, potatoes, soy, wheat, and yeast-related products.   Reports UBW of 112 lbs. She reports she lost 10 lbs (9% body weight) "quickly" due to dietary changes with food allergies, but was unsure of exact time frame. Patient was 108 lbs on 02/12/2016.   Medications reviewed and none pertinent.  Labs reviewed:  Glucose 125, Anion gap 3.   Nutrition-Focused physical exam completed. Findings are no fat depletion, no muscle depletion, and no edema. Patient with small body frame, but adequate-appearing muscle and fat stores. She reports she works out and runs regularly so she has always been "petite."  Discussed with Therapist, sports.   Diet Order:  Diet regular Room service appropriate? Yes; Fluid consistency: Thin  Skin:  Reviewed, no issues  Last BM:  12/04/2016  Height:   Ht Readings from Last 1 Encounters:  12/04/16 5\' 1"  (1.549 m)    Weight:   Wt Readings from Last 1 Encounters:  12/04/16 102 lb (46.3 kg)    Ideal Body Weight:  47.73 kg  BMI:  Body mass index is 19.27 kg/m.  Estimated Nutritional Needs:   Kcal:  1300-1515 (MSJ x 1.2-1.4)  Protein:  56-70 grams (1.2-1.5 grams/kg)  Fluid:  >/= 1.4-1.6 L/day (30-35 ml/kg)  EDUCATION NEEDS:   Education needs addressed (Importance of adequate calories and protein to prevent further weight loss. Patient likely to need education in outpatient setting in setting of multiple food allergies.)  Willey Blade, MS, RD, LDN Pager: [phone] After Hours Pager: [phone]

## 2016-12-06 LAB — BASIC METABOLIC PANEL
Anion gap: 5 (ref 5–15)
BUN: 6 mg/dL (ref 6–20)
CALCIUM: 8.3 mg/dL — AB (ref 8.9–10.3)
CHLORIDE: 108 mmol/L (ref 101–111)
CO2: 28 mmol/L (ref 22–32)
CREATININE: 0.55 mg/dL (ref 0.44–1.00)
GFR calc Af Amer: >60 mL/min (ref >60)
Glucose, Bld: 95 mg/dL (ref 65–99)
Potassium: 3.8 mmol/L (ref 3.5–5.1)
SODIUM: 141 mmol/L (ref 135–145)

## 2016-12-06 LAB — CBC
HCT: 28.7 % — ABNORMAL LOW (ref 36.0–46.0)
Hemoglobin: 9.7 g/dL — ABNORMAL LOW (ref 12.0–15.0)
MCH: 31.7 pg (ref 26.0–34.0)
MCHC: 33.8 g/dL (ref 30.0–36.0)
MCV: 93.8 fL (ref 78.0–100.0)
PLATELETS: 192 10*3/uL (ref 150–400)
RBC: 3.06 MIL/uL — ABNORMAL LOW (ref 3.87–5.11)
RDW: 12.8 % (ref 11.5–15.5)
WBC: 6.2 10*3/uL (ref 4.0–10.5)

## 2016-12-06 NOTE — Progress Notes (Signed)
2 Days Post-Op robotic rectopexy Subjective: Did well overnight, pain controlled, passing flatus  Objective: Vital signs in last 24 hours: Temp:  [98.7 F (37.1 C)-99.6 F (37.6 C)] 99.6 F (37.6 C) (12/09 0529) Pulse Rate:  [60-71] 71 (12/09 0529) Resp:  [16-18] 18 (12/09 0529) BP: (101-113)/(53-62) 103/53 (12/09 0529) SpO2:  [96 %-99 %] 96 % (12/09 0529)   Intake/Output from previous day: 12/08 0701 - 12/09 0700 In: 1232 [P.O.:1232] Out: 2089 [Urine:1950; Drains:139] Intake/Output this shift: Total I/O In: -  Out: 37 [Drains:37]  General appearance: alert and cooperative Abd: soft, non-distended. JP: SS drainage Incision: no significant drainage  Lab Results:   Recent Labs  12/05/16 0422 12/06/16 0509  WBC 7.5 6.2  HGB 10.3* 9.7*  HCT 30.5* 28.7*  PLT 219 192   BMET  Recent Labs  12/05/16 0422 12/06/16 0509  NA 138 141  K 3.9 3.8  CL 106 108  CO2 29 28  GLUCOSE 125* 95  BUN 8 6  CREATININE 0.76 0.55  CALCIUM 8.0* 8.3*   PT/INR No results for input(s): LABPROT, INR in the last 72 hours. ABG No results for input(s): PHART, HCO3 in the last 72 hours.  Invalid input(s): PCO2, PO2  MEDS, Scheduled . enoxaparin (LOVENOX) injection  40 mg Subcutaneous Q24H  . feeding supplement  1 Container Oral TID BM  . sertraline  300 mg Oral Daily    Studies/Results: No results found.  Assessment: s/p Procedure(s): XI ROBOTIC RECTOPEXY Patient Active Problem List   Diagnosis Date Noted  . Rectal prolapse 12/04/2016  . Colon cancer ([place]) 02/09/2015  . Anxiety and depression 02/08/2014  . Ovarian cyst, right 02/04/2013  . CIN I (cervical intraepithelial neoplasia I)     Expected post op course  Plan: Cont reg diet PO pain meds Ambulate JP a little too bloody to d/c today Repeat CBC in AM Poss d/c in AM if Hgb stable   LOS: 2 days     .Rosario Adie, [facility], [place]   12/06/2016 9:37 AM

## 2016-12-07 LAB — BASIC METABOLIC PANEL
ANION GAP: 6 (ref 5–15)
BUN: 9 mg/dL (ref 6–20)
CALCIUM: 8.5 mg/dL — AB (ref 8.9–10.3)
CO2: 27 mmol/L (ref 22–32)
Chloride: 108 mmol/L (ref 101–111)
Creatinine, Ser: 0.55 mg/dL (ref 0.44–1.00)
GLUCOSE: 93 mg/dL (ref 65–99)
POTASSIUM: 3.7 mmol/L (ref 3.5–5.1)
SODIUM: 141 mmol/L (ref 135–145)

## 2016-12-07 LAB — CBC
HEMATOCRIT: 27.6 % — AB (ref 36.0–46.0)
HEMOGLOBIN: 9.4 g/dL — AB (ref 12.0–15.0)
MCH: 32.1 pg (ref 26.0–34.0)
MCHC: 34.1 g/dL (ref 30.0–36.0)
MCV: 94.2 fL (ref 78.0–100.0)
Platelets: 187 10*3/uL (ref 150–400)
RBC: 2.93 MIL/uL — AB (ref 3.87–5.11)
RDW: 12.8 % (ref 11.5–15.5)
WBC: 7.6 10*3/uL (ref 4.0–10.5)

## 2016-12-07 MED ORDER — HYDROCODONE-ACETAMINOPHEN 5-325 MG PO TABS: 1.0000 | ORAL_TABLET | ORAL | 0 refills | Status: DC | PRN

## 2016-12-07 NOTE — Progress Notes (Signed)
Assessment unchanged. Pt verbalized understanding of dc instructions through teach back including follow up care and when to call the doctor. Discharged via wc to front entrance accompanied by NT and husband.

## 2016-12-07 NOTE — Discharge Instructions (Signed)
ABDOMINAL SURGERY: POST OP INSTRUCTIONS  1. DIET: Follow a light bland diet the first 24 hours after arrival home, such as soup, liquids, crackers, etc.  Be sure to include lots of fluids daily.  Avoid fast food or heavy meals as your are more likely to get nauseated.   2. Take your usually prescribed home medications unless otherwise directed. 3. PAIN CONTROL: a. Pain is best controlled by a usual combination of three different methods TOGETHER: i. Ice/Heat ii. Over the counter pain medication iii. Prescription pain medication b. Most patients will experience some swelling and bruising around the incisions.  Ice packs or heating pads (30-60 minutes up to 6 times a day) will help. Use ice for the first few days to help decrease swelling and bruising, then switch to heat to help relax tight/sore spots and speed recovery.  Some people prefer to use ice alone, heat alone, alternating between ice & heat.  Experiment to what works for you.  Swelling and bruising can take several weeks to resolve.   c. It is helpful to take an over-the-counter pain medication regularly for the first few weeks.  Choose one of the following that works best for you: i. Naproxen (Aleve, etc)  Two 220mg  tabs twice a day ii. Ibuprofen (Advil, etc) Three 200mg  tabs four times a day (every meal & bedtime) iii. Acetaminophen (Tylenol, etc) 500-650mg  four times a day (every meal & bedtime) d. A  prescription for pain medication (such as oxycodone, hydrocodone, etc) should be given to you upon discharge.  Take your pain medication as prescribed.  i. If you are having problems/concerns with the prescription medicine (does not control pain, nausea, vomiting, rash, itching, etc), please call us [phone] to see if we need to switch you to a different pain medicine that will work better for you and/or control your side effect better. ii. If you need a refill on your pain medication, please contact your pharmacy.  They will contact  our office to request authorization. Prescriptions will not be filled after 5 pm or on week-ends. 4. Avoid getting constipated.  Between the surgery and the pain medications, it is common to experience some constipation.  Increasing fluid intake and taking a fiber supplement (such as Metamucil, Citrucel, FiberCon, MiraLax, etc) 1-2 times a day regularly will usually help prevent this problem from occurring.  A mild laxative (prune juice, Milk of Magnesia, MiraLax, etc) should be taken according to package directions if there are no bowel movements after 48 hours.   5. Watch out for diarrhea.  If you have many loose bowel movements, simplify your diet to bland foods & liquids for a few days.  Stop any stool softeners and decrease your fiber supplement.  Switching to mild anti-diarrheal medications (Kayopectate, Pepto Bismol) can help.  If this worsens or does not improve, please call us. 6. Wash / shower every day.  You may shower over the incision / wound.  Avoid baths until the skin is fully healed.  Continue to shower over incision(s) after the dressing is off. 7. Remove your waterproof bandages 5 days after surgery.  You may leave the incision open to air.  You may replace a dressing/Band-Aid to cover the incision for comfort if you wish. 8. ACTIVITIES as tolerated:   a. You may resume regular (light) daily activities beginning the next day--such as daily self-care, walking, climbing stairs--gradually increasing activities as tolerated.  If you can walk 30 minutes without difficulty, it is safe to try more  intense activity such as jogging, treadmill, bicycling, low-impact aerobics, swimming, etc. b. Save the most intensive and strenuous activity for last such as sit-ups, heavy lifting, contact sports, etc  Refrain from any heavy lifting or straining until you are off narcotics for pain control.   c. DO NOT PUSH THROUGH PAIN.  Let pain be your guide: If it hurts to do something, don't do it.  Pain is your  body warning you to avoid that activity for another week until the pain goes down. d. You may drive when you are no longer taking prescription pain medication, you can comfortably wear a seatbelt, and you can safely maneuver your car and apply brakes. e. Dennis Bast may have sexual intercourse when it is comfortable.  9. FOLLOW UP in our office a. Please call [facility] at [phone] to set up an appointment to see your surgeon in the office for a follow-up appointment approximately 1-2 weeks after your surgery. b. Make sure that you call for this appointment the day you arrive home to insure a convenient appointment time. 10. IF YOU HAVE DISABILITY OR FAMILY LEAVE FORMS, BRING THEM TO THE OFFICE FOR PROCESSING.  DO NOT GIVE THEM TO YOUR DOCTOR.   WHEN TO CALL us [phone]: 1. Poor pain control 2. Reactions / problems with new medications (rash/itching, nausea, etc)  3. Fever over 101.5 F (38.5 C) 4. Inability to urinate 5. Nausea and/or vomiting 6. Worsening swelling or bruising 7. Continued bleeding from incision. 8. Increased pain, redness, or drainage from the incision  The clinic staff is available to answer your questions during regular business hours (8:30am-5pm).  Please dont hesitate to call and ask to speak to one of our nurses for clinical concerns.   A surgeon from [facility] is always on call at the hospitals   If you have a medical emergency, go to the nearest emergency room or call 911.    [facility], [address], [place], [place], [place]  [address] ? MAIN: [phone] ? TOLL FREE: [phone] ? FAX [phone] [email or web address]

## 2016-12-07 NOTE — Discharge Summary (Signed)
Physician Discharge Summary  Patient ID: Monica Lawson MRN: [id number] DOB/AGE: Dec 03, 1976 40 y.o.  Admit date: 12/04/2016 Discharge date: 12/07/2016  Admission Diagnoses: Rectal prolapse  Discharge Diagnoses:  Active Problems:   Rectal prolapse   Discharged Condition: good  Hospital Course: Pt admitted after surgery.  Diet was advanced as tolerated.  She was discharged to home once having bowel function, tolerating a diet, Hgb stable and her pain was controlled.  Consults: None  Significant Diagnostic Studies: labs: CBC  Treatments: IV hydration, analgesia: Vicodin and surgery: robotic rectopexy  Discharge Exam: Blood pressure 127/84, pulse 68, temperature 98.9 F (37.2 C), temperature source Oral, resp. rate 14, height 5\' 1"  (1.549 m), weight 46.3 kg (102 lb), SpO2 98 %. General appearance: alert and cooperative GI: soft, appropriately tender Incision/Wound: clean, dry, intact  Disposition: 01-Home or Self Care     Medication List    TAKE these medications   ALPRAZolam 0.5 MG tablet Commonly known as:  XANAX Take 0.5 mg by mouth every 4 (four) hours as needed for anxiety. anxiety   BENADRYL DYE-FREE ALLERGY 25 mg capsule Generic drug:  diphenhydrAMINE Take 25 mg by mouth every 6 (six) hours as needed for allergies.   CALCIUM + D PO Take 1 tablet by mouth 2 (two) times daily.   HYDROcodone-acetaminophen 5-325 MG tablet Commonly known as:  NORCO/VICODIN Take 1-2 tablets by mouth every 4 (four) hours as needed for moderate pain or severe pain.   Lysine 1000 MG Tabs Take 1,000 mg by mouth daily.   multivitamin tablet Take 1 tablet by mouth 2 (two) times daily.   Probiotic Caps Take 1 capsule by mouth daily.   valACYclovir 500 MG tablet Commonly known as:  VALTREX Take 500 mg by mouth daily as needed (coldsores).   ZINC PO Take 1 tablet by mouth daily.   ZOLOFT 100 MG tablet Generic drug:  sertraline Take 300 mg by mouth daily.      Follow-up  Information    Rosario Adie., MD. Schedule an appointment as soon as possible for a visit in 2 week(s).   Specialty:  General Surgery Contact information: [address] [place] [address] [phone]           Signed: Rosario Adie [id number], 8:41 AM

## 2016-12-07 NOTE — Care Management Note (Signed)
Case Management Note  Patient Details  Name: Monica Lawson MRN: [id number] Date of Birth: [birth year]  Subjective/Objective:     Rectal prolapse, s/p XI robotic rectopexy               Action/Plan: Discharge Planning: AVS reviewed:  Chart reviewed. No NCM needs identified.   PCP Marda Stalker MD  Expected Discharge Date:  12/07/2016             Expected Discharge Plan:  Home/Self Care  In-House Referral:  NA  Discharge planning Services  CM Consult  Post Acute Care Choice:  NA Choice offered to:  NA  DME Arranged:  N/A DME Agency:  NA  HH Arranged:  NA HH Agency:  NA  Status of Service:  Completed, signed off  If discussed at [place] of Stay Meetings, dates discussed:    Additional Comments:  Erenest Rasher, RN 12/07/2016, 9:38 AM

## 2017-01-23 ENCOUNTER — Other Ambulatory Visit: Payer: Self-pay | Admitting: Surgery

## 2017-01-23 ENCOUNTER — Ambulatory Visit
Admission: RE | Admit: 2017-01-23 | Discharge: 2017-01-23 | Disposition: A | Discharge status: Home / Self Care | Payer: BLUE CROSS/BLUE SHIELD | Source: Ambulatory Visit | Attending: Surgery | Admitting: Surgery

## 2017-01-23 DIAGNOSIS — R109 Unspecified abdominal pain: Secondary | ICD-10-CM

## 2017-02-12 ENCOUNTER — Encounter: Payer: Self-pay | Admitting: Women's Health

## 2017-02-12 ENCOUNTER — Ambulatory Visit (INDEPENDENT_AMBULATORY_CARE_PROVIDER_SITE_OTHER): Payer: BLUE CROSS/BLUE SHIELD | Admitting: Women's Health

## 2017-02-12 VITALS — BP 116/74 | Ht 62.5 in | Wt 104.8 lb

## 2017-02-12 DIAGNOSIS — Z1151 Encounter for screening for human papillomavirus (HPV): Secondary | ICD-10-CM

## 2017-02-12 DIAGNOSIS — Z01419 Encounter for gynecological examination (general) (routine) without abnormal findings: Secondary | ICD-10-CM

## 2017-02-12 LAB — COMPREHENSIVE METABOLIC PANEL
ALT: 18 U/L (ref 6–29)
AST: 30 U/L (ref 10–30)
Albumin: 4.5 g/dL (ref 3.6–5.1)
Alkaline Phosphatase: 86 U/L (ref 33–115)
BUN: 14 mg/dL (ref 7–25)
CHLORIDE: 102 mmol/L (ref 98–110)
CO2: 27 mmol/L (ref 20–31)
CREATININE: 0.73 mg/dL (ref 0.50–1.10)
Calcium: 9.1 mg/dL (ref 8.6–10.2)
GLUCOSE: 91 mg/dL (ref 65–99)
POTASSIUM: 4.1 mmol/L (ref 3.5–5.3)
SODIUM: 137 mmol/L (ref 135–146)
TOTAL PROTEIN: 7.1 g/dL (ref 6.1–8.1)
Total Bilirubin: 0.2 mg/dL (ref 0.2–1.2)

## 2017-02-12 LAB — CBC WITH DIFFERENTIAL/PLATELET
BASOS ABS: 63 {cells}/uL (ref 0–200)
Basophils Relative: 1 %
EOS PCT: 2 %
Eosinophils Absolute: 126 cells/uL (ref 15–500)
HCT: 39.7 % (ref 35.0–45.0)
Hemoglobin: 13.2 g/dL (ref 11.7–15.5)
Lymphocytes Relative: 36 %
Lymphs Abs: 2268 cells/uL (ref 850–3900)
MCH: 30.8 pg (ref 27.0–33.0)
MCHC: 33.2 g/dL (ref 32.0–36.0)
MCV: 92.5 fL (ref 80.0–100.0)
MPV: 9.7 fL (ref 7.5–12.5)
Monocytes Absolute: 441 cells/uL (ref 200–950)
Monocytes Relative: 7 %
NEUTROS ABS: 3402 {cells}/uL (ref 1500–7800)
Neutrophils Relative %: 54 %
PLATELETS: 286 10*3/uL (ref 140–400)
RBC: 4.29 MIL/uL (ref 3.80–5.10)
RDW: 13.5 % (ref 11.0–15.0)
WBC: 6.3 10*3/uL (ref 3.8–10.8)

## 2017-02-12 NOTE — Patient Instructions (Signed)

## 2017-02-12 NOTE — Progress Notes (Signed)
Monica Lawson Jun 03, 1976 [id number]    History:    Presents for annual exam. 11/2014 Mirena IUD with rare bleeding. Desires no children. Normal Pap history. Had a normal mammogram in 2015. Mother breast cancer age 39. 01/2014 colon cancer negative colonoscopy 2016. 11/2016 rectal  prolapse/rectopexy. Seeing a nutritionist to help with weight gain GI issues. Dr. [facility]. History of anxiety and depression. HSV rare outbreaks.  Past medical history, past surgical history, family history and social history were all reviewed and documented in the EPIC chart. Works for [employer].   ROS:  A ROS was performed and pertinent positives and negatives are included.  Exam:  Vitals:   02/12/17 1511  BP: 116/74  Weight: 104 lb 12.8 oz (47.5 kg)  Height: 5' 2.5" (1.588 m)   Body mass index is 18.86 kg/m.   General appearance:  Normal Thyroid:  Symmetrical, normal in size, without palpable masses or nodularity. Respiratory  Auscultation:  Clear without wheezing or rhonchi Cardiovascular  Auscultation:  Regular rate, without rubs, murmurs or gallops  Edema/varicosities:  Not grossly evident Abdominal  Soft,nontender, without masses, guarding or rebound.  Liver/spleen:  No organomegaly noted  Hernia:  None appreciated  Skin  Inspection:  Grossly normal   Breasts: Examined lying and sitting.     Right: Without masses, retractions, discharge or axillary adenopathy.     Left: Without masses, retractions, discharge or axillary adenopathy. Gentitourinary   Inguinal/mons:  Normal without inguinal adenopathy  External genitalia:  Normal  BUS/Urethra/Skene's glands:  Normal  Vagina:  Normal  Cervix:  Normal IUD strings visible  Uterus:  normal in size, shape and contour.  Midline and mobile  Adnexa/parametria:     Rt: Without masses or tenderness.   Lt: Without masses or tenderness.  Anus and perineum: Normal  Digital rectal exam: Normal sphincter tone without palpated masses or  tenderness  Assessment/Plan:  41 y.o. MWF G0 for annual exam.    11/2014 Mirena IUD rare bleeding 01/2014 colon cancer with negative colonoscopy 2016 Dr Monica Lawson 11/2016 rectal prolapse/rectopexy Anxiety/depression stable on Zoloft HSV rare outbreaks  Plan: Valtrex 500 twice daily 3-5 days when necessary. SBE's, annual screening mammogram at 41. Encouraged regular exercise, leisure activities, vitamins. CBC, CMP, UA, Pap with HR HPV typing, new screening guidelines reviewed.    Monica Lawson, 5:02 PM 02/12/2017

## 2017-02-13 ENCOUNTER — Encounter: Payer: Self-pay | Admitting: Women's Health

## 2017-02-14 LAB — PAP, TP IMAGING W/ HPV RNA, RFLX HPV TYPE 16,18/45: HPV mRNA, High Risk: NOT DETECTED

## 2017-11-24 ENCOUNTER — Encounter: Payer: Self-pay | Admitting: Women's Health

## 2018-02-16 ENCOUNTER — Encounter: Payer: Self-pay | Admitting: Women's Health

## 2018-02-16 ENCOUNTER — Ambulatory Visit (INDEPENDENT_AMBULATORY_CARE_PROVIDER_SITE_OTHER): Payer: BLUE CROSS/BLUE SHIELD | Admitting: Women's Health

## 2018-02-16 VITALS — BP 102/68 | Ht 62.4 in | Wt 108.6 lb

## 2018-02-16 DIAGNOSIS — Z01419 Encounter for gynecological examination (general) (routine) without abnormal findings: Secondary | ICD-10-CM

## 2018-02-16 NOTE — Progress Notes (Signed)
Monica Lawson [birth year] [id number]    History:    Presents for annual exam. 11/2014 Mirena IUD amenorrheic. Normal Pap history. Normal mammogram 2015. Mother breast cancer survivors for age 40. 42 rectal cancer, last colonoscopy 2018 negative. 2017 rectopexy for prolapse. HSV rare outbreaks. History of anxiety, depression and panic psychiatrist and counseling manage.   Past medical history, past surgical history, family history and social history were all reviewed and documented in the EPIC chart. Desk job.  ROS:  A ROS was performed and pertinent positives and negatives are included.  Exam:  Vitals:   02/16/18 0948  BP: 102/68  Weight: 108 lb 9.6 oz (49.3 kg)  Height: 5' 2.4" (1.585 m)   Body mass index is 19.61 kg/m.   General appearance:  Normal Thyroid:  Symmetrical, normal in size, without palpable masses or nodularity. Respiratory  Auscultation:  Clear without wheezing or rhonchi Cardiovascular  Auscultation:  Regular rate, without rubs, murmurs or gallops  Edema/varicosities:  Not grossly evident Abdominal  Soft,nontender, without masses, guarding or rebound.  Liver/spleen:  No organomegaly noted  Hernia:  None appreciated  Skin  Inspection:  Grossly normal   Breasts: Examined lying and sitting.     Right: Without masses, retractions, discharge or axillary adenopathy.     Left: Without masses, retractions, discharge or axillary adenopathy. Gentitourinary   Inguinal/mons:  Normal without inguinal adenopathy  External genitalia:  Normal  BUS/Urethra/Skene's glands:  Normal  Vagina:  Normal  Cervix:  Normal IUD strings visible  Uterus:   normal in size, shape and contour.  Midline and mobile  Adnexa/parametria:     Rt: Without masses or tenderness.   Lt: Without masses or tenderness.  Anus and perineum: Normal  Digital rectal exam: Normal sphincter tone without palpated masses or tenderness  Assessment/Plan:  42 y.o. MWF G0  for annual exam with no  complaints.  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 SBE's, annual screening mammogram at 42. Encouraged regular exercise, calcium rich foods, vitamin D 1000 daily encouraged.  Keep scheduled follow-up with GI -Dr Benson [place]. Pap normal with negative HR HPV 2018, new screening guidelines reviewed.  [name], 1:03 PM 02/16/2018

## 2018-02-16 NOTE — Patient Instructions (Signed)

## 2018-09-12 DIAGNOSIS — F411 Generalized anxiety disorder: Secondary | ICD-10-CM | POA: Insufficient documentation

## 2018-09-12 DIAGNOSIS — F319 Bipolar disorder, unspecified: Secondary | ICD-10-CM

## 2018-09-28 ENCOUNTER — Ambulatory Visit: Payer: BLUE CROSS/BLUE SHIELD | Admitting: Mental Health

## 2018-11-22 ENCOUNTER — Encounter: Payer: Self-pay | Admitting: Emergency Medicine

## 2018-12-03 ENCOUNTER — Ambulatory Visit: Payer: BLUE CROSS/BLUE SHIELD | Admitting: Physician Assistant

## 2018-12-03 ENCOUNTER — Encounter: Payer: Self-pay | Admitting: Physician Assistant

## 2018-12-03 ENCOUNTER — Other Ambulatory Visit: Payer: Self-pay | Admitting: Nurse Practitioner

## 2018-12-03 DIAGNOSIS — F411 Generalized anxiety disorder: Secondary | ICD-10-CM

## 2018-12-03 MED ORDER — SERTRALINE HCL 100 MG PO TABS: 300.0000 mg | ORAL_TABLET | Freq: Every day | ORAL | 1 refills | Status: DC

## 2018-12-03 MED ORDER — ALPRAZOLAM 0.5 MG PO TABS: 0.5000 mg | ORAL_TABLET | Freq: Four times a day (QID) | ORAL | 5 refills | Status: DC | PRN

## 2018-12-03 NOTE — Progress Notes (Signed)
[facility] Check  Patient ID: Monica Lawson,  MRN: [id number]  PCP: Marda Stalker, PA-C  Date of Evaluation: 12/03/2018 Time spent:15 minutes  Chief Complaint:  Chief Complaint    Follow-up      HISTORY/CURRENT STATUS: HPI For routine med check.   Since LOV, has had a thorough PE and was found to have hypothyroidism.  Is now on Levothyroxine.  Has thyroid nodules and will be having further w/u for that.  "I'm not worrying about it until they tell me something is wrong."  Patient denies loss of interest in usual activities and is able to enjoy things.  Denies decreased energy or motivation.  Appetite has not changed.  No extreme sadness, tearfulness, or feelings of hopelessness.  Denies any changes in concentration, making decisions or remembering things.  Denies suicidal or homicidal thoughts.  Since being on the Zoloft at this dose she's been doing really great.  She states the anxiety is well controlled.  The Xanax continues to help a lot.  Some days she only takes 1 but most often needs at least several.   Individual Medical History/ Review of Systems: Changes? :Yes    Past medications for mental health diagnoses include: Zoloft, Xanax, trazodone, BuSpar, Lexapro,  Allergies: Blue dyes (parenteral); Corn-containing products; Lactose intolerance (gi); Other; Red dye; Yeast-related products; Ciprofloxacin in d5w; Soy allergy; and Wheat bran  Current Medications:  Current Outpatient Medications:  .  ALPRAZolam (XANAX) 0.5 MG tablet, Take 1 tablet (0.5 mg total) by mouth 4 (four) times daily as needed for anxiety., Disp: 120 tablet, Rfl: 5 .  Calcium Carbonate-Vitamin D (CALCIUM + D PO), Take 1 tablet by mouth 2 (two) times daily. , Disp: , Rfl:  .  Dietary Management Product (VISBIOME) PACK, TAKE 1 PACKET DAILY AS DIRECTED, Disp: , Rfl: 12 .  diphenhydrAMINE (BENADRYL DYE-FREE ALLERGY) 25 mg capsule, Take 25 mg by mouth every 6 (six) hours as needed for allergies.,  Disp: , Rfl:  .  levothyroxine (SYNTHROID, LEVOTHROID) 25 MCG tablet, Take 25 mcg by mouth daily., Disp: , Rfl: 1 .  Lysine 1000 MG TABS, Take 1,000 mg by mouth daily. , Disp: , Rfl:  .  Multiple Vitamin (MULTIVITAMIN) tablet, Take 1 tablet by mouth 2 (two) times daily. , Disp: , Rfl:  .  Multiple Vitamins-Minerals (ZINC PO), Take 1 tablet by mouth daily., Disp: , Rfl:  .  sertraline (ZOLOFT) 100 MG tablet, Take 3 tablets (300 mg total) by mouth daily., Disp: 270 tablet, Rfl: 1 .  spironolactone (ALDACTONE) 25 MG tablet, Take 25 mg by mouth daily., Disp: , Rfl: 2 .  valACYclovir (VALTREX) 500 MG tablet, Take 500 mg by mouth daily as needed (coldsores). , Disp: , Rfl:   Current Facility-Administered Medications:  .  levonorgestrel (MIRENA) 20 MCG/24HR IUD, , Intrauterine, Once, Fontaine, Belinda Block, MD Medication Side Effects: none  Family Medical/ Social History: Changes? No  MENTAL HEALTH EXAM:  There were no vitals taken for this visit.There is no height or weight on file to calculate BMI.  General Appearance: Casual, Neat and Well Groomed  Eye Contact:  Good  Speech:  Clear and Coherent  Volume:  Normal  Mood:  Euthymic  Affect:  Appropriate  Thought Process:  Goal Directed  Orientation:  Full (Time, Place, and Person)  Thought Content: Logical   Suicidal Thoughts:  No  Homicidal Thoughts:  No  Memory:  WNL  Judgement:  Good  Insight:  Good  Psychomotor Activity:  Normal  Concentration:  Concentration: Good  Recall:  Good  Fund of Knowledge: Good  Language: Good  Assets:  Desire for Improvement  ADL's:  Intact  Cognition: WNL  Prognosis:  Good    DIAGNOSES:    ICD-10-CM   1. Generalized anxiety disorder F41.1   New diagnosis of hypothyroidism with thyroid nodules.  Work-up pending.  Receiving Psychotherapy: Yes with Rosary Lively, LPC    RECOMMENDATIONS: Continue current meds without change. Return in 6 months or sooner as needed.   Donnal Moat, PA-C

## 2018-12-09 ENCOUNTER — Other Ambulatory Visit: Payer: Self-pay | Admitting: Nurse Practitioner

## 2018-12-09 ENCOUNTER — Ambulatory Visit
Admission: RE | Admit: 2018-12-09 | Discharge: 2018-12-09 | Disposition: A | Discharge status: Home / Self Care | Payer: Self-pay | Source: Ambulatory Visit | Attending: Nurse Practitioner | Admitting: Nurse Practitioner

## 2018-12-09 DIAGNOSIS — E041 Nontoxic single thyroid nodule: Secondary | ICD-10-CM

## 2019-01-13 ENCOUNTER — Other Ambulatory Visit (HOSPITAL_COMMUNITY)
Admission: RE | Admit: 2019-01-13 | Discharge: 2019-01-13 | Disposition: A | Discharge status: Home / Self Care | Payer: BLUE CROSS/BLUE SHIELD | Source: Ambulatory Visit | Attending: Radiology | Admitting: Radiology

## 2019-01-13 ENCOUNTER — Ambulatory Visit
Admission: RE | Admit: 2019-01-13 | Discharge: 2019-01-13 | Disposition: A | Discharge status: Home / Self Care | Payer: BLUE CROSS/BLUE SHIELD | Source: Ambulatory Visit | Attending: Nurse Practitioner | Admitting: Nurse Practitioner

## 2019-01-13 DIAGNOSIS — E041 Nontoxic single thyroid nodule: Secondary | ICD-10-CM | POA: Diagnosis not present

## 2019-02-21 ENCOUNTER — Ambulatory Visit: Payer: BLUE CROSS/BLUE SHIELD | Admitting: Women's Health

## 2019-02-21 ENCOUNTER — Encounter: Payer: Self-pay | Admitting: Women's Health

## 2019-02-21 VITALS — BP 118/80 | Ht 62.0 in | Wt 114.0 lb

## 2019-02-21 DIAGNOSIS — B009 Herpesviral infection, unspecified: Secondary | ICD-10-CM

## 2019-02-21 DIAGNOSIS — Z01419 Encounter for gynecological examination (general) (routine) without abnormal findings: Secondary | ICD-10-CM | POA: Diagnosis not present

## 2019-02-21 MED ORDER — VALACYCLOVIR HCL 500 MG PO TABS: 500.0000 mg | ORAL_TABLET | Freq: Every day | ORAL | 4 refills | Status: AC | PRN

## 2019-02-21 NOTE — Addendum Note (Signed)
Addended by: Lorine Bears on: 02/21/2019 10:55 AM   Modules accepted: Orders

## 2019-02-21 NOTE — Patient Instructions (Addendum)
Vit D3 '1000mg'$   Daily (avoid the calcium supp) Breast center  [phone]  Health Maintenance, Female Adopting a healthy lifestyle and getting preventive care can go a long way to promote health and wellness. Talk with your health care provider about what schedule of regular examinations is right for you. This is a good chance for you to check in with your provider about disease prevention and staying healthy. In between checkups, there are plenty of things you can do on your own. Experts have done a lot of research about which lifestyle changes and preventive measures are most likely to keep you healthy. Ask your health care provider for more information. Weight and diet Eat a healthy diet  Be sure to include plenty of vegetables, fruits, low-fat dairy products, and lean protein.  Do not eat a lot of foods high in solid fats, added sugars, or salt.  Get regular exercise. This is one of the most important things you can do for your health. ? Most adults should exercise for at least 150 minutes each week. The exercise should increase your heart rate and make you sweat (moderate-intensity exercise). ? Most adults should also do strengthening exercises at least twice a week. This is in addition to the moderate-intensity exercise. Maintain a healthy weight  Body mass index (BMI) is a measurement that can be used to identify possible weight problems. It estimates body fat based on height and weight. Your health care provider can help determine your BMI and help you achieve or maintain a healthy weight.  For females 28 years of age and older: ? A BMI below 18.5 is considered underweight. ? A BMI of 18.5 to 24.9 is normal. ? A BMI of 25 to 29.9 is considered overweight. ? A BMI of 30 and above is considered obese. Watch levels of cholesterol and blood lipids  You should start having your blood tested for lipids and cholesterol at 41 years of age, then have this test every 5 years.  You may need to  have your cholesterol levels checked more often if: ? Your lipid or cholesterol levels are high. ? You are older than 41 years of age. ? You are at high risk for heart disease. Cancer screening Lung Cancer  Lung cancer screening is recommended for adults 21-13 years old who are at high risk for lung cancer because of a history of smoking.  A yearly low-dose CT scan of the lungs is recommended for people who: ? Currently smoke. ? Have quit within the past 15 years. ? Have at least a 30-pack-year history of smoking. A pack year is smoking an average of one pack of cigarettes a day for 1 year.  Yearly screening should continue until it has been 15 years since you quit.  Yearly screening should stop if you develop a health problem that would prevent you from having lung cancer treatment. Breast Cancer  Practice breast self-awareness. This means understanding how your breasts normally appear and feel.  It also means doing regular breast self-exams. Let your health care provider know about any changes, no matter how small.  If you are in your 20s or 30s, you should have a clinical breast exam (CBE) by a health care provider every 1-3 years as part of a regular health exam.  If you are 83 or older, have a CBE every year. Also consider having a breast X-ray (mammogram) every year.  If you have a family history of breast cancer, talk to your health care provider  about genetic screening.  If you are at high risk for breast cancer, talk to your health care provider about having an MRI and a mammogram every year.  Breast cancer gene (BRCA) assessment is recommended for women who have family members with BRCA-related cancers. BRCA-related cancers include: ? Breast. ? Ovarian. ? Tubal. ? Peritoneal cancers.  Results of the assessment will determine the need for genetic counseling and BRCA1 and BRCA2 testing. Cervical Cancer Your health care provider may recommend that you be screened  regularly for cancer of the pelvic organs (ovaries, uterus, and vagina). This screening involves a pelvic examination, including checking for microscopic changes to the surface of your cervix (Pap test). You may be encouraged to have this screening done every 3 years, beginning at age 25.  For women ages 33-65, health care providers may recommend pelvic exams and Pap testing every 3 years, or they may recommend the Pap and pelvic exam, combined with testing for human papilloma virus (HPV), every 5 years. Some types of HPV increase your risk of cervical cancer. Testing for HPV may also be done on women of any age with unclear Pap test results.  Other health care providers may not recommend any screening for nonpregnant women who are considered low risk for pelvic cancer and who do not have symptoms. Ask your health care provider if a screening pelvic exam is right for you.  If you have had past treatment for cervical cancer or a condition that could lead to cancer, you need Pap tests and screening for cancer for at least 20 years after your treatment. If Pap tests have been discontinued, your risk factors (such as having a new sexual partner) need to be reassessed to determine if screening should resume. Some women have medical problems that increase the chance of getting cervical cancer. In these cases, your health care provider may recommend more frequent screening and Pap tests. Colorectal Cancer  This type of cancer can be detected and often prevented.  Routine colorectal cancer screening usually begins at 41 years of age and continues through 41 years of age.  Your health care provider may recommend screening at an earlier age if you have risk factors for colon cancer.  Your health care provider may also recommend using home test kits to check for hidden blood in the stool.  A small camera at the end of a tube can be used to examine your colon directly (sigmoidoscopy or colonoscopy). This is  done to check for the earliest forms of colorectal cancer.  Routine screening usually begins at age 78.  Direct examination of the colon should be repeated every 5-10 years through 40 years of age. However, you may need to be screened more often if early forms of precancerous polyps or small growths are found. Skin Cancer  Check your skin from head to toe regularly.  Tell your health care provider about any new moles or changes in moles, especially if there is a change in a mole's shape or color.  Also tell your health care provider if you have a mole that is larger than the size of a pencil eraser.  Always use sunscreen. Apply sunscreen liberally and repeatedly throughout the day.  Protect yourself by wearing long sleeves, pants, a wide-brimmed hat, and sunglasses whenever you are outside. Heart disease, diabetes, and high blood pressure  High blood pressure causes heart disease and increases the risk of stroke. High blood pressure is more likely to develop in: ? People who have blood  pressure in the high end of the normal range (130-139/85-89 mm Hg). ? People who are overweight or obese. ? People who are African American.  If you are 34-3 years of age, have your blood pressure checked every 3-5 years. If you are 45 years of age or older, have your blood pressure checked every year. You should have your blood pressure measured twice-once when you are at a hospital or clinic, and once when you are not at a hospital or clinic. Record the average of the two measurements. To check your blood pressure when you are not at a hospital or clinic, you can use: ? An automated blood pressure machine at a pharmacy. ? A home blood pressure monitor.  If you are between 1 years and 17 years old, ask your health care provider if you should take aspirin to prevent strokes.  Have regular diabetes screenings. This involves taking a blood sample to check your fasting blood sugar level. ? If you are at a  normal weight and have a low risk for diabetes, have this test once every three years after 41 years of age. ? If you are overweight and have a high risk for diabetes, consider being tested at a younger age or more often. Preventing infection Hepatitis B  If you have a higher risk for hepatitis B, you should be screened for this virus. You are considered at high risk for hepatitis B if: ? You were born in a country where hepatitis B is common. Ask your health care provider which countries are considered high risk. ? Your parents were born in a high-risk country, and you have not been immunized against hepatitis B (hepatitis B vaccine). ? You have HIV or AIDS. ? You use needles to inject street drugs. ? You live with someone who has hepatitis B. ? You have had sex with someone who has hepatitis B. ? You get hemodialysis treatment. ? You take certain medicines for conditions, including cancer, organ transplantation, and autoimmune conditions. Hepatitis C  Blood testing is recommended for: ? Everyone born from [birth year] through 1963. ? Anyone with known risk factors for hepatitis C. Sexually transmitted infections (STIs)  You should be screened for sexually transmitted infections (STIs) including gonorrhea and chlamydia if: ? You are sexually active and are younger than 41 years of age. ? You are older than 41 years of age and your health care provider tells you that you are at risk for this type of infection. ? Your sexual activity has changed since you were last screened and you are at an increased risk for chlamydia or gonorrhea. Ask your health care provider if you are at risk.  If you do not have HIV, but are at risk, it may be recommended that you take a prescription medicine daily to prevent HIV infection. This is called pre-exposure prophylaxis (PrEP). You are considered at risk if: ? You are sexually active and do not regularly use condoms or know the HIV status of your partner(s). ? You  take drugs by injection. ? You are sexually active with a partner who has HIV. Talk with your health care provider about whether you are at high risk of being infected with HIV. If you choose to begin PrEP, you should first be tested for HIV. You should then be tested every 3 months for as long as you are taking PrEP. Pregnancy  If you are premenopausal and you may become pregnant, ask your health care provider about preconception counseling.  If  you may become pregnant, take 400 to 800 micrograms (mcg) of folic acid every day.  If you want to prevent pregnancy, talk to your health care provider about birth control (contraception). Osteoporosis and menopause  Osteoporosis is a disease in which the bones lose minerals and strength with aging. This can result in serious bone fractures. Your risk for osteoporosis can be identified using a bone density scan.  If you are 61 years of age or older, or if you are at risk for osteoporosis and fractures, ask your health care provider if you should be screened.  Ask your health care provider whether you should take a calcium or vitamin D supplement to lower your risk for osteoporosis.  Menopause may have certain physical symptoms and risks.  Hormone replacement therapy may reduce some of these symptoms and risks. Talk to your health care provider about whether hormone replacement therapy is right for you. Follow these instructions at home:  Schedule regular health, dental, and eye exams.  Stay current with your immunizations.  Do not use any tobacco products including cigarettes, chewing tobacco, or electronic cigarettes.  If you are pregnant, do not drink alcohol.  If you are breastfeeding, limit how much and how often you drink alcohol.  Limit alcohol intake to no more than 1 drink per day for nonpregnant women. One drink equals 12 ounces of beer, 5 ounces of wine, or 1 ounces of hard liquor.  Do not use street drugs.  Do not share  needles.  Ask your health care provider for help if you need support or information about quitting drugs.  Tell your health care provider if you often feel depressed.  Tell your health care provider if you have ever been abused or do not feel safe at home. This information is not intended to replace advice given to you by your health care provider. Make sure you discuss any questions you have with your health care provider. Document Released: 06/30/2011 Document Revised: 05/22/2016 Document Reviewed: 09/18/2015 Elsevier Interactive Patient Education  2019 [employer].

## 2019-02-21 NOTE — Progress Notes (Signed)
Monica Lawson [birth year] [id number]    History:    Presents for annual exam.  11/2014 Mirena IUD amenorrhea.  CIN-1 many years ago normal Paps after.  Normal  mammogram history.  2015 rectal cancer surgery, radiation.  2018- colonoscopy.  Hypothyroid in the past year, 12/2018- thyroid biopsy on Synthroid.  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  Anxiety/depression/bipolar therapist and primary care manage.  Past medical history, past surgical history, family history and social history were all reviewed and documented in the EPIC chart.  Mother breast cancer at age 1/survivor.  ROS:  A ROS was performed and pertinent positives and negatives are included.  Exam:  Vitals:   02/21/19 0928  BP: 118/80  Weight: 114 lb (51.7 kg)  Height: 5\' 2"  (1.575 m)   Body mass index is 20.85 kg/m.   General appearance:  Normal Thyroid:  Symmetrical, normal in size, without palpable masses or nodularity. Respiratory  Auscultation:  Clear without wheezing or rhonchi Cardiovascular  Auscultation:  Regular rate, without rubs, murmurs or gallops  Edema/varicosities:  Not grossly evident Abdominal  Soft,nontender, without masses, guarding or rebound.  Liver/spleen:  No organomegaly noted  Hernia:  None appreciated  Skin  Inspection:  Grossly normal   Breasts: Examined lying and sitting.     Right: Without masses, retractions, discharge or axillary adenopathy.     Left: Without masses, retractions, discharge or axillary adenopathy. Gentitourinary   Inguinal/mons:  Normal without inguinal adenopathy  External genitalia:  Normal  BUS/Urethra/Skene's glands:  Normal  Vagina: Atrophic              cervix:  Normal IUD strings visible  Uterus: normal in size, shape and contour.  Midline and mobile  Adnexa/parametria:     Rt: Without masses or tenderness.   Lt: Without masses or tenderness.  Anus and perineum: Normal  Digital rectal exam: Normal sphincter tone without palpated masses or tenderness  Assessment/Plan:  43  y.o. WF G0 for annual exam.  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 Phineas Real.  SBEs, start annual screening mammogram, breast center information given instructed to schedule.  Continue healthy lifestyle of regular exercise, healthy diet.  Has worked with [facility] to help with dyspareunia.  Encouraged over-the-counter Replens vaginal lubricant 2-3 times weekly may also help.  Leisure activities encouraged.  Pap with HR HPV typing, new screening guidelines reviewed.   [name], 9:53 AM 02/21/2019

## 2019-02-22 LAB — PAP, TP IMAGING W/ HPV RNA, RFLX HPV TYPE 16,18/45: HPV DNA HIGH RISK: NOT DETECTED

## 2019-06-03 ENCOUNTER — Other Ambulatory Visit: Payer: Self-pay

## 2019-06-03 ENCOUNTER — Ambulatory Visit: Payer: BC Managed Care – PPO | Admitting: Physician Assistant

## 2019-06-03 ENCOUNTER — Encounter: Payer: Self-pay | Admitting: Physician Assistant

## 2019-06-03 DIAGNOSIS — F411 Generalized anxiety disorder: Secondary | ICD-10-CM

## 2019-06-03 MED ORDER — ALPRAZOLAM 1 MG PO TABS: 0.5000 mg | ORAL_TABLET | Freq: Two times a day (BID) | ORAL | 5 refills | Status: DC | PRN

## 2019-06-03 MED ORDER — SERTRALINE HCL 100 MG PO TABS: 300.0000 mg | ORAL_TABLET | Freq: Every day | ORAL | 1 refills | Status: DC

## 2019-06-03 NOTE — Progress Notes (Signed)
[facility] Med Check  Patient ID: Monica Lawson,  MRN: [id number]  PCP: Simona Huh, NP  Date of Evaluation: 06/03/2019 Time spent:15 minutes  Chief Complaint:  Chief Complaint    Anxiety      HISTORY/CURRENT STATUS: HPI For routine med check.  Due to insurance, needs to change Xanax to 1 mg and split as needed.  She got a letter stating that giving 0.5 mg more than 3 times a day exceeded their quantity limit.  States she is doing really well!  The anxiety is very much controlled.  States that since we increased the Zoloft up to 300 mg along a while back she has been much better.  The Xanax has been working very well to.  She will often just take 0.25 mg and then repeat if needed throughout the day.  She has been working from home and that has decreased her anxiety somewhat too.  She sleeps well.  Patient denies loss of interest in usual activities and is able to enjoy things.  Denies decreased energy or motivation.  Appetite has not changed.  No extreme sadness, tearfulness, or feelings of hopelessness.  Denies any changes in concentration, making decisions or remembering things.  Denies suicidal or homicidal thoughts.  Denies dizziness, syncope, seizures, numbness, tingling, tremor, tics, unsteady gait, slurred speech, confusion.  Denies muscle or joint pain, stiffness, or dystonia.  Individual Medical History/ Review of Systems: Changes? :No    Past medications for mental health diagnoses include: Zoloft, Xanax, trazodone, BuSpar, Lexapro,  Allergies: Blue dyes (parenteral); Corn-containing products; Lactose intolerance (gi); Other; Red dye; Yeast-related products; Ciprofloxacin in d5w; Soy allergy; and Wheat bran  Current Medications:  Current Outpatient Medications:  .  ALPRAZolam (XANAX) 0.5 MG tablet, Take 1 tablet (0.5 mg total) by mouth 4 (four) times daily as needed for anxiety., Disp: 120 tablet, Rfl: 5 .  Calcium Carbonate-Vitamin D (CALCIUM + D PO), Take 1 tablet  by mouth 2 (two) times daily. , Disp: , Rfl:  .  Dietary Management Product (VISBIOME) PACK, TAKE 1 PACKET DAILY AS DIRECTED, Disp: , Rfl: 12 .  diphenhydrAMINE (BENADRYL DYE-FREE ALLERGY) 25 mg capsule, Take 25 mg by mouth every 6 (six) hours as needed for allergies., Disp: , Rfl:  .  levothyroxine (SYNTHROID, LEVOTHROID) 25 MCG tablet, Take 25 mcg by mouth daily., Disp: , Rfl: 1 .  Lysine 1000 MG TABS, Take 1,000 mg by mouth daily. , Disp: , Rfl:  .  Multiple Vitamin (MULTIVITAMIN) tablet, Take 1 tablet by mouth 2 (two) times daily. , Disp: , Rfl:  .  Multiple Vitamins-Minerals (ZINC PO), Take 1 tablet by mouth daily., Disp: , Rfl:  .  sertraline (ZOLOFT) 100 MG tablet, Take 3 tablets (300 mg total) by mouth daily., Disp: 270 tablet, Rfl: 1 .  spironolactone (ALDACTONE) 25 MG tablet, Take 25 mg by mouth daily., Disp: , Rfl: 2 .  valACYclovir (VALTREX) 500 MG tablet, Take 1 tablet (500 mg total) by mouth daily as needed (coldsores)., Disp: 90 tablet, Rfl: 4  Current Facility-Administered Medications:  .  levonorgestrel (MIRENA) 20 MCG/24HR IUD, , Intrauterine, Once, Fontaine, Belinda Block, MD Medication Side Effects: none  Family Medical/ Social History: Changes? No  MENTAL HEALTH EXAM:  There were no vitals taken for this visit.There is no height or weight on file to calculate BMI.  General Appearance: Casual  Eye Contact:  Good  Speech:  Clear and Coherent  Volume:  Normal  Mood:  Euthymic  Affect:  Appropriate  Thought Process:  Goal Directed  Orientation:  Full (Time, Place, and Person)  Thought Content: Logical   Suicidal Thoughts:  No  Homicidal Thoughts:  No  Memory:  WNL  Judgement:  Good  Insight:  Good  Psychomotor Activity:  Normal  Concentration:  Concentration: Good  Recall:  Good  Fund of Knowledge: Good  Language: Good  Assets:  Desire for Improvement  ADL's:  Intact  Cognition: WNL  Prognosis:  Good    DIAGNOSES:    ICD-10-CM   1. Generalized anxiety  disorder F41.1     Receiving Psychotherapy: Yes With Rosary Lively, LPC   RECOMMENDATIONS:  Continue Zoloft 100 mg, 3 p.o. daily. Continue Xanax dose changed to 1 mg, 1/2-1 p.o. twice daily as needed.  She can split into 1/4 if she prefers.  She will get a pill cutter and do that.  Just no more than 2 pills total per day. Continue psychotherapy with Rosary Lively, LPC. Return in 6 months.  Donnal Moat, PA-C   This record has been created using [employer].  Chart creation errors have been sought, but may not always have been located and corrected. Such creation errors do not reflect on the standard of medical care.

## 2019-06-09 ENCOUNTER — Other Ambulatory Visit: Payer: Self-pay

## 2019-06-09 ENCOUNTER — Ambulatory Visit: Payer: BC Managed Care – PPO | Admitting: Mental Health

## 2019-06-09 ENCOUNTER — Encounter: Payer: Self-pay | Admitting: Mental Health

## 2019-06-09 DIAGNOSIS — F411 Generalized anxiety disorder: Secondary | ICD-10-CM | POA: Diagnosis not present

## 2019-06-09 NOTE — Patient Instructions (Signed)
11, 20 

## 2019-06-09 NOTE — Progress Notes (Signed)
[facility] Counselor/Therapist Progress Note  Patient ID: Monica Lawson, MRN: [id number],    Date: 06/09/2019  Time Spent: 45 minutes  Treatment Type: Individual Therapy  Reported Symptoms: Nervous, agitated, edgy. Unable to sit still.  Mental Status Exam:  Appearance:   Casual     Behavior:  Agitated  Motor:  Restlestness  Speech/Language:   Pressured  Affect:  Labile  Mood:  anxious, depressed, irritable and sad  Thought process:  normal  Thought content:    WNL  Sensory/Perceptual disturbances:    WNL  Orientation:  oriented to person, place and time/date  Attention:  Good  Concentration:  Good  Memory:  WNL  Fund of knowledge:   Good  Insight:    Good  Judgment:   Good  Impulse Control:  Good   Risk Assessment: Danger to Self:  No Self-injurious Behavior: No Danger to Others: No Duty to Warn:no Physical Aggression / Violence:No  Access to Firearms a concern: No  Gang Involvement:No   Subjective:  Because she has autoimmune disorder, has been very stressed about Corona Virus Pandemic. Has been sheltered in place for months during quarantine  And she and husband are working from home. Very eager to purchase a home, but husband prefers to spend money on high end luxury automobiles. Feels frustrated in marriage because her personal goals are not being met. Job is going well. Marital satisfaction is low.   Interventions: Solution-Oriented/Positive Psychology, Insight-Oriented, Interpersonal and supprtive  Diagnosis:   ICD-10-CM   1. Generalized anxiety disorder  F41.1       Treatment Plan   Patient Name: Monica Lawson    Date: June 09, 2019   Didactic topic to be discussed:           Anxiety:                    Locus of control                                          Work/Lifebalance          Depression                             Problem-solving                              Relationships                                   Boundaries                                      Coping srategies                             Communication                    Recovery from trauma                    Self-care  Validation  Other     Goals:  Patient  1. Maintains mood stabiity:  decreased symptoms of     depression     anxiety  2.   Practices pro-active self-care:   restful sleep, nutrition, exercise, socialization  3.   Effective utilizes boundaries and sets limits  4.   Utliizes coping strategies and problem solving techniques for stress management  5.   Feels accurately heard, understood and validated  Other: Marital satisfaction increased      Logan Bores [place], [facility]

## 2019-06-21 ENCOUNTER — Other Ambulatory Visit: Payer: Self-pay

## 2019-06-21 ENCOUNTER — Ambulatory Visit: Payer: BC Managed Care – PPO | Admitting: Mental Health

## 2019-06-21 ENCOUNTER — Encounter: Payer: Self-pay | Admitting: Mental Health

## 2019-06-21 DIAGNOSIS — F422 Mixed obsessional thoughts and acts: Secondary | ICD-10-CM | POA: Diagnosis not present

## 2019-06-21 DIAGNOSIS — F411 Generalized anxiety disorder: Secondary | ICD-10-CM | POA: Diagnosis not present

## 2019-06-21 NOTE — Progress Notes (Signed)
[facility] Counselor/Therapist Progress Note  Patient ID: Monica Lawson, MRN: [id number],    Date: 06/21/2019  Time Spent: 50 minutes  Treatment Type: Individual Therapy  Reported Symptoms: Extremely anxious and stressed. Complains of OCD symptoms. Has a little snake in her year and now she sees multiple snakes in her imagination and is hesitant to go out.Tired of the whole COVID 19 experience.  Mental Status Exam:  Appearance:   Casual     Behavior:  Agitated  Motor:  Restlestness  Speech/Language:   Pressured  Affect:  Depressed  Mood:  anxious, depressed and irritable  Thought process:  obsessie and worried  Thought content:    Obsessions and Rumination  Sensory/Perceptual disturbances:    WNL  Orientation:  oriented to person, place and time/date  Attention:  Good  Concentration:  Good  Memory:  WNL  Fund of knowledge:   Good  Insight:    Good  Judgment:   Good  Impulse Control:   Good     Risks:  Danger to Self    No Self Injurious Behavior    No Danger to Others...Marland KitchenMarland KitchenNo Duty to Warn....   No Physical Aggression/ Violence     No Access to Firearms a Concern   ....Marland KitchenMarland KitchenNo Gang Involvement:No   Subjective: Wants life to return to normal. Tired of limitations of COVID experience. Complains is having more trouble with OCD lately. Upset with her husband who does not take her desires and goals into consideration when he purchases lavish automobiles. Debating whether she wants to remain in the marriage. Wants a true partnership. Feels she has said all she can say about it to him. 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    Interventions: Solution-Oriented/Positive Psychology, Insight-Oriented, Interpersonal and supportive  Diagnosis:   ICD-10-CM   1. Generalized anxiety disorder  F41.1   2. Mixed obsessional thoughts and acts  F42.2        Treatment Plan   Patient Name:      Monica Lawson   Date:    June 21, 2019   Didactic topic to be discussed:           Anxiety:                   Locus of control                              Work/Life balance           Depression                             Problem-solving                              Relationships                                   Boundaries                                     Coping srategies  Communication                    Recovery from trauma                    Self-care                                     Validation  Other     Goals:  Patient  1. Maintains mood stabiity:  decreased symptoms of     depression     anxiety  2.   Practices pro-active self-care:   restful sleep, nutrition, exercise, socialization  3.   Effective utilizes boundaries and sets limits  4.   Utliizes coping strategies and problem solving techniques for stress management  5.   Feels accurately heard, understood and validated  Other      Logan Bores [place], [facility]

## 2019-11-29 ENCOUNTER — Ambulatory Visit: Payer: BC Managed Care – PPO | Admitting: Physician Assistant

## 2019-12-28 ENCOUNTER — Other Ambulatory Visit: Payer: Self-pay

## 2019-12-28 ENCOUNTER — Encounter: Payer: Self-pay | Admitting: Physician Assistant

## 2019-12-28 ENCOUNTER — Ambulatory Visit: Payer: BC Managed Care – PPO | Admitting: Physician Assistant

## 2019-12-28 DIAGNOSIS — F422 Mixed obsessional thoughts and acts: Secondary | ICD-10-CM | POA: Diagnosis not present

## 2019-12-28 DIAGNOSIS — F411 Generalized anxiety disorder: Secondary | ICD-10-CM

## 2019-12-28 MED ORDER — ALPRAZOLAM 1 MG PO TABS: 0.5000 mg | ORAL_TABLET | Freq: Two times a day (BID) | ORAL | 5 refills | Status: DC | PRN

## 2019-12-28 MED ORDER — SERTRALINE HCL 100 MG PO TABS: 300.0000 mg | ORAL_TABLET | Freq: Every day | ORAL | 1 refills | Status: DC

## 2019-12-28 NOTE — Progress Notes (Signed)
[facility] Check  Patient ID: Monica Lawson,  MRN: [id number]  PCP: Simona Huh, NP  Date of Evaluation: 12/28/2019 Time spent:15 minutes  Chief Complaint:  Chief Complaint    Anxiety; Depression; Follow-up      HISTORY/CURRENT STATUS: HPI For routine med check.  States she is doing really well!  The anxiety is very much controlled.  States that since we increased the Zoloft up to 300 mg along a while back she has been much better.  The Xanax has been working very well too.  She will often just take 0.25 mg and then repeat if needed throughout the day.  She has been working from home and that has decreased her anxiety somewhat too.  She sleeps well.  Patient denies loss of interest in usual activities and is able to enjoy things.  Denies decreased energy or motivation.  Appetite has not changed.  No extreme sadness, tearfulness, or feelings of hopelessness.  Denies any changes in concentration, making decisions or remembering things.  Denies suicidal or homicidal thoughts.  Denies dizziness, syncope, seizures, numbness, tingling, tremor, tics, unsteady gait, slurred speech, confusion.  Denies muscle or joint pain, stiffness, or dystonia.  Individual Medical History/ Review of Systems: Changes? :No    Past medications for mental health diagnoses include: Zoloft, Xanax, trazodone, BuSpar, Lexapro,  Allergies: Blue dyes (parenteral), Corn-containing products, Lactose intolerance (gi), Other, Red dye, Yeast-related products, Ciprofloxacin in d5w, Soy allergy, and Wheat bran  Current Medications:  Current Outpatient Medications:  .  ALPRAZolam (XANAX) 1 MG tablet, Take 0.5-1 tablets (0.5-1 mg total) by mouth 2 (two) times daily as needed for anxiety., Disp: 60 tablet, Rfl: 5 .  Calcium Carbonate-Vitamin D (CALCIUM + D PO), Take 1 tablet by mouth 2 (two) times daily. , Disp: , Rfl:  .  Dietary Management Product (VISBIOME) PACK, TAKE 1 PACKET DAILY AS DIRECTED, Disp: , Rfl:  12 .  diphenhydrAMINE (BENADRYL DYE-FREE ALLERGY) 25 mg capsule, Take 25 mg by mouth every 6 (six) hours as needed for allergies., Disp: , Rfl:  .  levothyroxine (SYNTHROID, LEVOTHROID) 25 MCG tablet, Take 50 mcg by mouth daily. , Disp: , Rfl: 1 .  Lysine 1000 MG TABS, Take 1,000 mg by mouth daily. , Disp: , Rfl:  .  Multiple Vitamin (MULTIVITAMIN) tablet, Take 1 tablet by mouth 2 (two) times daily. , Disp: , Rfl:  .  Multiple Vitamins-Minerals (ZINC PO), Take 1 tablet by mouth daily., Disp: , Rfl:  .  sertraline (ZOLOFT) 100 MG tablet, Take 3 tablets (300 mg total) by mouth daily., Disp: 270 tablet, Rfl: 1 .  spironolactone (ALDACTONE) 25 MG tablet, Take 25 mg by mouth daily., Disp: , Rfl: 2 .  Turmeric 450 MG CAPS, Take by mouth., Disp: , Rfl:  .  valACYclovir (VALTREX) 500 MG tablet, Take 1 tablet (500 mg total) by mouth daily as needed (coldsores)., Disp: 90 tablet, Rfl: 4  Current Facility-Administered Medications:  .  levonorgestrel (MIRENA) 20 MCG/24HR IUD, , Intrauterine, Once, Fontaine, Belinda Block, MD Medication Side Effects: none  Family Medical/ Social History: Changes? No  MENTAL HEALTH EXAM:  There were no vitals taken for this visit.There is no height or weight on file to calculate BMI.  General Appearance: Casual, Neat and Well Groomed  Eye Contact:  Good  Speech:  Clear and Coherent  Volume:  Normal  Mood:  Euthymic  Affect:  Appropriate  Thought Process:  Goal Directed and Descriptions of Associations: Intact  Orientation:  Full (Time,  Place, and Person)  Thought Content: Logical   Suicidal Thoughts:  No  Homicidal Thoughts:  No  Memory:  WNL  Judgement:  Good  Insight:  Good  Psychomotor Activity:  Normal  Concentration:  Concentration: Good and Attention Span: Good  Recall:  Good  Fund of Knowledge: Good  Language: Good  Assets:  Desire for Improvement  ADL's:  Intact  Cognition: WNL  Prognosis:  Good    DIAGNOSES:    ICD-10-CM   1. Mixed obsessional  thoughts and acts  F42.2   2. Generalized anxiety disorder  F41.1     Receiving Psychotherapy: Yes With Rosary Lively, LPC   RECOMMENDATIONS:  Continue Zoloft 100 mg, 3 p.o. daily. Continue Xanax dose changed to 1 mg, 1/2-1 p.o. twice daily as needed.  She can split into 1/4 if she prefers.  She will get a pill cutter and do that.  Just no more than 2 pills total per day. Continue psychotherapy with Rosary Lively, LPC. Return in 6 months.  Donnal Moat, PA-C

## 2020-02-27 ENCOUNTER — Encounter: Payer: Self-pay | Admitting: Women's Health

## 2020-02-27 ENCOUNTER — Other Ambulatory Visit: Payer: Self-pay

## 2020-02-27 ENCOUNTER — Other Ambulatory Visit: Payer: Self-pay | Admitting: Women's Health

## 2020-02-27 ENCOUNTER — Ambulatory Visit: Payer: BC Managed Care – PPO | Admitting: Women's Health

## 2020-02-27 VITALS — BP 110/78 | Ht 62.0 in | Wt 110.0 lb

## 2020-02-27 DIAGNOSIS — Z01419 Encounter for gynecological examination (general) (routine) without abnormal findings: Secondary | ICD-10-CM | POA: Diagnosis not present

## 2020-02-27 DIAGNOSIS — N912 Amenorrhea, unspecified: Secondary | ICD-10-CM

## 2020-02-27 LAB — PREGNANCY, URINE: Preg Test, Ur: NEGATIVE

## 2020-02-27 NOTE — Progress Notes (Addendum)
Monica Lawson [birth year] [id number]    History:    44 yo WF presents for annual exam with no specific complaints. 11/2014 Mirena IUD amenorrhea..  2020 ASC-US, negative HR HPV.  Normal  mammogram history.  2015 rectal cancer surgery, removal of carcinoid polyp.  2018- colonoscopy negative.  Hypothyroid, 12/2018- thyroid biopsy on Synthroid.  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  Anxiety/depression/bipolar therapist and primary care manage.  Dyspareunia managed by urologist with physical therapy and biofeedback.  History of CIN-1 many years ago.  Past medical history, past surgical history, family history and social history were all reviewed and documented in the EPIC chart. Received flu vaccine, unsure of when she can receive Covid vaccine. Has been working from home since last March. Mother breast cancer at age 58, survivor.  2015 cholecystectomy.  ROS:  A ROS was performed and pertinent positives and negatives are included.  Exam:  Vitals:   02/27/20 1411  BP: 110/78  Weight: 110 lb (49.9 kg)  Height: 5\' 2"  (1.575 m)   Body mass index is 20.12 kg/m.   General appearance:  Normal Thyroid:  Symmetrical, normal in size, without palpable masses or nodularity. Respiratory  Auscultation:  Clear without wheezing or rhonchi Cardiovascular  Auscultation:  Regular rate, without rubs, murmurs or gallops  Edema/varicosities:  Not grossly evident Abdominal  Soft,nontender, without masses, guarding or rebound.  Liver/spleen:  No organomegaly noted  Hernia:  None appreciated  Skin  Inspection:  Grossly normal   Breasts: Examined lying and sitting.     Right: Without masses, retractions, discharge or axillary adenopathy.     Left: Without masses, retractions, discharge or axillary adenopathy. Gentitourinary   Inguinal/mons:  Normal without inguinal adenopathy  External genitalia:  Normal  BUS/Urethra/Skene's glands:  Normal  Vagina:  Normal  Cervix:  Normal  Uterus:  Normal in size, shape and contour.   Midline and mobile  Adnexa/parametria:     Rt: Without masses or tenderness.   Lt: Without masses or tenderness.  Anus and perineum: Normal  Assessment/Plan:  44 y.o.  MWF G0 for annual exam with no complaints of vaginal discharge, urinary symptoms, or abdominal pain.  .   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 Aware needs to have IUD replaced, will make appointment, instructed to use condoms until IUD can be replaced.UPT neg. Counseled on importance of yearly mammogram screenings, [facility] to make appointment for [facility]D mammogram. Last coloscopy was 2018- GI manages. Continue healthy lifestyle of regular exercise, healthy diet, vitamin D 2000iu daily, calcium rich foods.  Has worked with [facility] to help with dyspareunia, states has been helping strengthen pelvic floor muscles, encouraged to continue these exercises.    [name] [facility], 3:38 PM 02/27/2020

## 2020-02-27 NOTE — Patient Instructions (Addendum)
Good to see you today Vit D 2000 iu daily [facility]  [phone]  [address] Health Maintenance, Female Adopting a healthy lifestyle and getting preventive care are important in promoting health and wellness. Ask your health care provider about:  The right schedule for you to have regular tests and exams.  Things you can do on your own to prevent diseases and keep yourself healthy. What should I know about diet, weight, and exercise? Eat a healthy diet   Eat a diet that includes plenty of vegetables, fruits, low-fat dairy products, and lean protein.  Do not eat a lot of foods that are high in solid fats, added sugars, or sodium. Maintain a healthy weight Body mass index (BMI) is used to identify weight problems. It estimates body fat based on height and weight. Your health care provider can help determine your BMI and help you achieve or maintain a healthy weight. Get regular exercise Get regular exercise. This is one of the most important things you can do for your health. Most adults should:  Exercise for at least 150 minutes each week. The exercise should increase your heart rate and make you sweat (moderate-intensity exercise).  Do strengthening exercises at least twice a week. This is in addition to the moderate-intensity exercise.  Spend less time sitting. Even light physical activity can be beneficial. Watch cholesterol and blood lipids Have your blood tested for lipids and cholesterol at 42 years of age, then have this test every 5 years. Have your cholesterol levels checked more often if:  Your lipid or cholesterol levels are high.  You are older than 42 years of age.  You are at high risk for heart disease. What should I know about cancer screening? Depending on your health history and family history, you may need to have cancer screening at various ages. This may include screening for:  Breast cancer.  Cervical cancer.  Colorectal  cancer.  Skin cancer.  Lung cancer. What should I know about heart disease, diabetes, and high blood pressure? Blood pressure and heart disease  High blood pressure causes heart disease and increases the risk of stroke. This is more likely to develop in people who have high blood pressure readings, are of African descent, or are overweight.  Have your blood pressure checked: ? Every 3-5 years if you are 103-38 years of age. ? Every year if you are 61 years old or older. Diabetes Have regular diabetes screenings. This checks your fasting blood sugar level. Have the screening done:  Once every three years after age 31 if you are at a normal weight and have a low risk for diabetes.  More often and at a younger age if you are overweight or have a high risk for diabetes. What should I know about preventing infection? Hepatitis B If you have a higher risk for hepatitis B, you should be screened for this virus. Talk with your health care provider to find out if you are at risk for hepatitis B infection. Hepatitis C Testing is recommended for:  Everyone born from [birth year] through 1963.  Anyone with known risk factors for hepatitis C. Sexually transmitted infections (STIs)  Get screened for STIs, including gonorrhea and chlamydia, if: ? You are sexually active and are younger than 42 years of age. ? You are older than 42 years of age and your health care provider tells you that you are at risk for this type of infection. ? Your sexual activity has changed since  you were last screened, and you are at increased risk for chlamydia or gonorrhea. Ask your health care provider if you are at risk.  Ask your health care provider about whether you are at high risk for HIV. Your health care provider may recommend a prescription medicine to help prevent HIV infection. If you choose to take medicine to prevent HIV, you should first get tested for HIV. You should then be tested every 3 months for as long as  you are taking the medicine. Pregnancy  If you are about to stop having your period (premenopausal) and you may become pregnant, seek counseling before you get pregnant.  Take 400 to 800 micrograms (mcg) of folic acid every day if you become pregnant.  Ask for birth control (contraception) if you want to prevent pregnancy. Osteoporosis and menopause Osteoporosis is a disease in which the bones lose minerals and strength with aging. This can result in bone fractures. If you are 47 years old or older, or if you are at risk for osteoporosis and fractures, ask your health care provider if you should:  Be screened for bone loss.  Take a calcium or vitamin D supplement to lower your risk of fractures.  Be given hormone replacement therapy (HRT) to treat symptoms of menopause. Follow these instructions at home: Lifestyle  Do not use any products that contain nicotine or tobacco, such as cigarettes, e-cigarettes, and chewing tobacco. If you need help quitting, ask your health care provider.  Do not use street drugs.  Do not share needles.  Ask your health care provider for help if you need support or information about quitting drugs. Alcohol use  Do not drink alcohol if: ? Your health care provider tells you not to drink. ? You are pregnant, may be pregnant, or are planning to become pregnant.  If you drink alcohol: ? Limit how much you use to 0-1 drink a day. ? Limit intake if you are breastfeeding.  Be aware of how much alcohol is in your drink. In the [place] one drink equals one 12 oz bottle of beer (355 mL), one 5 oz glass of wine (148 mL), or one 1 oz glass of hard liquor (44 mL). General instructions  Schedule regular health, dental, and eye exams.  Stay current with your vaccines.  Tell your health care provider if: ? You often feel depressed. ? You have ever been abused or do not feel safe at home. Summary  Adopting a healthy lifestyle and getting preventive care are  important in promoting health and wellness.  Follow your health care provider's instructions about healthy diet, exercising, and getting tested or screened for diseases.  Follow your health care provider's instructions on monitoring your cholesterol and blood pressure. This information is not intended to replace advice given to you by your health care provider. Make sure you discuss any questions you have with your health care provider. Document Revised: 12/08/2018 Document Reviewed: 12/08/2018 Elsevier Patient Education  2020 [employer].

## 2020-02-29 LAB — PAP IG W/ RFLX HPV ASCU

## 2020-03-01 ENCOUNTER — Other Ambulatory Visit: Payer: Self-pay | Admitting: Surgery

## 2020-03-08 ENCOUNTER — Other Ambulatory Visit: Payer: Self-pay

## 2020-03-08 ENCOUNTER — Encounter: Payer: Self-pay | Admitting: Obstetrics and Gynecology

## 2020-03-08 ENCOUNTER — Ambulatory Visit: Payer: BC Managed Care – PPO | Admitting: Obstetrics and Gynecology

## 2020-03-08 VITALS — BP 118/76

## 2020-03-08 DIAGNOSIS — Z30433 Encounter for removal and reinsertion of intrauterine contraceptive device: Secondary | ICD-10-CM | POA: Diagnosis not present

## 2020-03-08 NOTE — Progress Notes (Signed)
   Monica Lawson  1976/02/03 [id number]   The patient is a 44 y.o. G0P0000 who presents today for Mirena IUD removal and insertion of a new Mirena IUD.  Her IUD was placed in December 2015.   I counseled her on the Mirena IUD including in the initial period of time after insertion to expect cramping and/or abnormal bleeding, especially in the first 3-6 months use.  After this time period the period can get very light or some women become amenorrheic (which she has been).  Insertion risks include infection, uterine perforation and device migration into the abdomen which would require surgical removal, or the device can become dislodged causing pain or fall out of which would preclude effective contraception.  The IUD was inserted successfully today and the patient tolerated the procedure relatively well.  IUD will be effective for contraception immediately.  She agreed to proceed and signed the consent form.    BP 118/76   Mirena IUD removal and insertion procedure note  The cervix was visualized with a speculum.  The IUD strings were seen protruding from the external os.  The IUD strings are grasped with a [place] forceps and the device was removed with mild resistance, and was removed intact, as was confirmed by patient, myself, and staff.  Pelvic the exam indicated the uterus was retroverted.  The cervix was cleansed with a Betadine swab.  The anterior lip of the cervix was grasped with an Allis clamp.  Endometrial pipelle was used to obtain a sound length of 6 cm.  Maintaining sterility, the flange on the IUD insertion tube was set to the appropriate length and the insertion tube was inserted into the cervix and endometrial cavity without difficulty until gentle fundal resistance was met.  The arms of the IUD were deployed and the Mirena was inserted without difficulty. The strings were trimmed to a length of 2 cm.  The patient tolerated the procedure well without any complication.  Lot number  [id number]  Caryn Bee present for the procedure    Joseph Pierini MD 03/08/20

## 2020-03-09 ENCOUNTER — Encounter: Payer: Self-pay | Admitting: Gynecology

## 2020-03-14 ENCOUNTER — Other Ambulatory Visit: Payer: Self-pay | Admitting: Surgery

## 2020-06-25 ENCOUNTER — Encounter: Payer: Self-pay | Admitting: Physician Assistant

## 2020-06-25 ENCOUNTER — Other Ambulatory Visit: Payer: Self-pay

## 2020-06-25 ENCOUNTER — Ambulatory Visit (INDEPENDENT_AMBULATORY_CARE_PROVIDER_SITE_OTHER): Payer: 59 | Admitting: Physician Assistant

## 2020-06-25 DIAGNOSIS — F422 Mixed obsessional thoughts and acts: Secondary | ICD-10-CM

## 2020-06-25 DIAGNOSIS — F411 Generalized anxiety disorder: Secondary | ICD-10-CM | POA: Diagnosis not present

## 2020-06-25 MED ORDER — SERTRALINE HCL 100 MG PO TABS: 300.0000 mg | ORAL_TABLET | Freq: Every day | ORAL | 1 refills | Status: DC

## 2020-06-25 MED ORDER — ALPRAZOLAM 1 MG PO TABS: 0.5000 mg | ORAL_TABLET | Freq: Two times a day (BID) | ORAL | 5 refills | Status: DC | PRN

## 2020-06-25 NOTE — Progress Notes (Signed)
[facility]  Patient ID: Monica Lawson,  MRN: [id number]  PCP: Simona Huh, NP  Date of Evaluation: 06/25/2020 Time spent:20 minutes  Chief Complaint:  Chief Complaint    Follow-up      HISTORY/CURRENT STATUS: HPI For routine med check.  Since we saw each other, she's made a major job change/move. Has only had a few days off in the past 3 months or so.  Has had more OCD sx during the move, where she was fixated on the #4, but it does seem to be getting better.   Patient denies loss of interest in usual activities and is able to enjoy things.  Denies decreased energy or motivation.  Appetite has not changed.  No extreme sadness, tearfulness, or feelings of hopelessness.  Denies any changes in concentration, making decisions or remembering things.  Denies suicidal or homicidal thoughts.  Patient denies increased energy with decreased need for sleep, no increased talkativeness, no racing thoughts, no impulsivity or risky behaviors, no increased spending, no increased libido, no grandiosity, no increased irritability or anger, and no hallucinations.  Denies dizziness, syncope, seizures, numbness, tingling, tremor, tics, unsteady gait, slurred speech, confusion.  Denies muscle or joint pain, stiffness, or dystonia.  Individual Medical History/ Review of Systems: Changes? :No    Past medications for mental health diagnoses include: Zoloft, Xanax, trazodone, BuSpar, Lexapro,  Allergies: Blue dyes (parenteral), Corn-containing products, Lactose intolerance (gi), Other, Red dye, Yeast-related products, Ciprofloxacin in d5w, Soy allergy, and Wheat bran  Current Medications:  Current Outpatient Medications:  .  ALPRAZolam (XANAX) 1 MG tablet, Take 0.5-1 tablets (0.5-1 mg total) by mouth 2 (two) times daily as needed for anxiety., Disp: 60 tablet, Rfl: 5 .  Calcium Carbonate-Vitamin D (CALCIUM + D PO), Take 1 tablet by mouth 2 (two) times daily. , Disp: , Rfl:  .  Dietary  Management Product (VISBIOME) PACK, TAKE 1 PACKET DAILY AS DIRECTED, Disp: , Rfl: 12 .  diphenhydrAMINE (BENADRYL DYE-FREE ALLERGY) 25 mg capsule, Take 25 mg by mouth every 6 (six) hours as needed for allergies., Disp: , Rfl:  .  levothyroxine (SYNTHROID) 50 MCG tablet, Take 75 mcg by mouth daily before breakfast., Disp: , Rfl:  .  Lysine 1000 MG TABS, Take 1,000 mg by mouth daily. , Disp: , Rfl:  .  Multiple Vitamin (MULTIVITAMIN) tablet, Take 1 tablet by mouth 2 (two) times daily. , Disp: , Rfl:  .  Multiple Vitamins-Minerals (ZINC PO), Take 1 tablet by mouth daily., Disp: , Rfl:  .  sertraline (ZOLOFT) 100 MG tablet, Take 3 tablets (300 mg total) by mouth daily., Disp: 270 tablet, Rfl: 1 .  spironolactone (ALDACTONE) 25 MG tablet, Take 25 mg by mouth daily., Disp: , Rfl: 2 .  Turmeric 450 MG CAPS, Take by mouth., Disp: , Rfl:  .  valACYclovir (VALTREX) 500 MG tablet, Take 1 tablet (500 mg total) by mouth daily as needed (coldsores)., Disp: 90 tablet, Rfl: 4  Current Facility-Administered Medications:  .  levonorgestrel (MIRENA) 20 MCG/24HR IUD, , Intrauterine, Once, Fontaine, Belinda Block, MD Medication Side Effects: none  Family Medical/ Social History: Changes? Job move to [place] EXAM:  There were no vitals taken for this visit.There is no height or weight on file to calculate BMI.  General Appearance: Casual, Neat and Well Groomed  Eye Contact:  Good  Speech:  Clear and Coherent and Normal Rate  Volume:  Normal  Mood:  Euthymic  Affect:  Appropriate  Thought Process:  Goal Directed and Descriptions of Associations: Intact  Orientation:  Full (Time, Place, and Person)  Thought Content: Logical   Suicidal Thoughts:  No  Homicidal Thoughts:  No  Memory:  WNL  Judgement:  Good  Insight:  Good  Psychomotor Activity:  Normal  Concentration:  Concentration: Good and Attention Span: Good  Recall:  Good  Fund of Knowledge: Good  Language: Good  Assets:  Desire  for Improvement  ADL's:  Intact  Cognition: WNL  Prognosis:  Good    DIAGNOSES:    ICD-10-CM   1. Mixed obsessional thoughts and acts  F42.2   2. Generalized anxiety disorder  F41.1     Receiving Psychotherapy: Yes With Rosary Lively, [facility]   RECOMMENDATIONS:  PDMP reviewed.  Discussed whether we need to increase the Zoloft or not, but we agree that she is getting better with the decrease in stress, as well as increased rest so we will leave the dose the same for now. Continue Zoloft 100 mg, 3 p.o. daily. Continue Xanax dose changed to 1 mg, 1/2-1 p.o. twice daily as needed.   Just no more than 2 pills total per day. Continue psychotherapy with Rosary Lively, LPC. Return in 6 months.  Donnal Moat, PA-C

## 2020-12-25 ENCOUNTER — Ambulatory Visit: Payer: 59 | Admitting: Physician Assistant

## 2020-12-31 ENCOUNTER — Telehealth: Payer: Self-pay | Admitting: Physician Assistant

## 2020-12-31 ENCOUNTER — Encounter: Payer: Self-pay | Admitting: Physician Assistant

## 2020-12-31 ENCOUNTER — Telehealth (INDEPENDENT_AMBULATORY_CARE_PROVIDER_SITE_OTHER): Payer: 59 | Admitting: Physician Assistant

## 2020-12-31 DIAGNOSIS — F422 Mixed obsessional thoughts and acts: Secondary | ICD-10-CM | POA: Diagnosis not present

## 2020-12-31 DIAGNOSIS — F411 Generalized anxiety disorder: Secondary | ICD-10-CM | POA: Diagnosis not present

## 2020-12-31 MED ORDER — SERTRALINE HCL 100 MG PO TABS: 300.0000 mg | ORAL_TABLET | Freq: Every day | ORAL | 1 refills | Status: DC

## 2020-12-31 MED ORDER — ALPRAZOLAM 1 MG PO TABS: 0.5000 mg | ORAL_TABLET | Freq: Two times a day (BID) | ORAL | 5 refills | Status: DC | PRN

## 2020-12-31 NOTE — Progress Notes (Signed)
[facility]  Patient ID: Monica Lawson,  MRN: [id number]  PCP: [name], NP  Date of Evaluation: 12/31/2020 Time spent:20 minutes  Chief Complaint:  Chief Complaint    Anxiety; Depression     Virtual Visit via Telehealth  I connected with patient by telephone, with their informed consent, and verified patient privacy and that I am speaking with the correct person using two identifiers.  I am private, in my office and the patient is at work.  I discussed the limitations, risks, security and privacy concerns of performing an evaluation and management service by telephone and the availability of in person appointments. I also discussed with the patient that there may be a patient responsible charge related to this service. The patient expressed understanding and agreed to proceed.   I discussed the assessment and treatment plan with the patient. The patient was provided an opportunity to ask questions and all were answered. The patient agreed with the plan and demonstrated an understanding of the instructions.   The patient was advised to call back or seek an in-person evaluation if the symptoms worsen or if the condition fails to improve as anticipated.  I provided 20 minutes of non-face-to-face time during this encounter.  HISTORY/CURRENT STATUS: HPI For routine med check.  Last seen 06/25/2020 and has been doing well since then. Feels like her meds are working well. Is doing well at work.  It has been around 9 months since they moved offices, which has been a very busy time but she is very happy there and things have calmed down after getting settled.  Anxiety is well controlled with the Xanax.  Not having obsessive thoughts like she did, the Zoloft does help prevent that.  No compulsions to speak of.  Not having panic attacks but more so a generalized sense of anxiety, unease.  Xanax continues to be helpful.  She is able to enjoy things.  Energy and motivation are good.   She still exercises daily.  Personal hygiene is normal.  Not isolating.  Not crying easily.  No suicidal or homicidal thoughts.  Patient denies increased energy with decreased need for sleep, no increased talkativeness, no racing thoughts, no impulsivity or risky behaviors, no increased spending, no increased libido, no grandiosity, no increased irritability or anger, and no hallucinations.  Denies dizziness, syncope, seizures, numbness, tingling, tremor, tics, unsteady gait, slurred speech, confusion.  Denies muscle or joint pain, stiffness, or dystonia.  Individual Medical History/ Review of Systems: Changes? :No    Past medications for mental health diagnoses include: Zoloft, Xanax, trazodone, BuSpar, Lexapro,  Allergies: Blue dyes (parenteral), Corn-containing products, Lactose intolerance (gi), Other, Red dye, Yeast-related products, Ciprofloxacin in d5w, Soy allergy, and Wheat bran  Current Medications:  Current Outpatient Medications:  .  Calcium Carbonate-Vitamin D (CALCIUM + D PO), Take 1 tablet by mouth 2 (two) times daily., Disp: , Rfl:  .  diphenhydrAMINE (BENADRYL) 25 mg capsule, Take 25 mg by mouth every 6 (six) hours as needed for allergies., Disp: , Rfl:  .  levothyroxine (SYNTHROID) 50 MCG tablet, Take 75 mcg by mouth daily before breakfast., Disp: , Rfl:  .  Lysine 1000 MG TABS, Take 1,000 mg by mouth daily. , Disp: , Rfl:  .  Multiple Vitamin (MULTIVITAMIN) tablet, Take 1 tablet by mouth 2 (two) times daily., Disp: , Rfl:  .  Multiple Vitamins-Minerals (ZINC PO), Take 1 tablet by mouth daily., Disp: , Rfl:  .  spironolactone (ALDACTONE) 25 MG tablet, Take  25 mg by mouth daily., Disp: , Rfl: 2 .  Turmeric 450 MG CAPS, Take by mouth., Disp: , Rfl:  .  valACYclovir (VALTREX) 500 MG tablet, Take 1 tablet (500 mg total) by mouth daily as needed (coldsores)., Disp: 90 tablet, Rfl: 4 .  ALPRAZolam (XANAX) 1 MG tablet, Take 0.5-1 tablets (0.5-1 mg total) by mouth 2 (two) times  daily as needed for anxiety., Disp: 60 tablet, Rfl: 5 .  sertraline (ZOLOFT) 100 MG tablet, Take 3 tablets (300 mg total) by mouth daily., Disp: 270 tablet, Rfl: 1  Current Facility-Administered Medications:  .  levonorgestrel (MIRENA) 20 MCG/24HR IUD, , Intrauterine, Once, Fontaine, Belinda Block, MD Medication Side Effects: none  Family Medical/ Social History: Changes? Job move to [place] EXAM:  There were no vitals taken for this visit.There is no height or weight on file to calculate BMI.  General Appearance: Unable to assess  Eye Contact:  Unable to assess  Speech:  Clear and Coherent and Normal Rate  Volume:  Normal  Mood:  Euthymic  Affect:  Unable to assess  Thought Process:  Goal Directed and Descriptions of Associations: Intact  Orientation:  Full (Time, Place, and Person)  Thought Content: Logical   Suicidal Thoughts:  No  Homicidal Thoughts:  No  Memory:  WNL  Judgement:  Good  Insight:  Good  Psychomotor Activity:  Unable to assess  Concentration:  Concentration: Good and Attention Span: Good  Recall:  Good  Fund of Knowledge: Good  Language: Good  Assets:  Desire for Improvement  ADL's:  Intact  Cognition: WNL  Prognosis:  Good    DIAGNOSES:    ICD-10-CM   1. Mixed obsessional thoughts and acts  F42.2   2. Generalized anxiety disorder  F41.1     Receiving Psychotherapy: Yes With Rosary Lively, [facility]   RECOMMENDATIONS:  PDMP reviewed.  I provided 20 minutes of nonface-to-face time during this encounter, in which we discussed the diagnosis and treatment options.  I am glad to see her doing so well.  Therefore no changes will be made. Continue Zoloft 100 mg, 3 p.o. daily. Continue Xanax dose changed to 1 mg, 1/2-1 p.o. twice daily as needed.  Continue psychotherapy with Rosary Lively, LPC. Return in 6 months.  Donnal Moat, PA-C

## 2020-12-31 NOTE — Telephone Encounter (Signed)
[name], firkus are scheduled for a virtual visit with your provider today.    Just as we do with appointments in the office, we must obtain your consent to participate.  Your consent will be active for this visit and any virtual visit you may have with one of our providers in the next 365 days.    If you have a MyChart account, I can also send a copy of this consent to you electronically.  All virtual visits are billed to your insurance company just like a traditional visit in the office.  As this is a virtual visit, video technology does not allow for your provider to perform a traditional examination.  This may limit your provider's ability to fully assess your condition.  If your provider identifies any concerns that need to be evaluated in person or the need to arrange testing such as labs, EKG, etc, we will make arrangements to do so.    Although advances in technology are sophisticated, we cannot ensure that it will always work on either your end or our end.  If the connection with a video visit is poor, we may have to switch to a telephone visit.  With either a video or telephone visit, we are not always able to ensure that we have a secure connection.   I need to obtain your verbal consent now.   Are you willing to proceed with your visit today?   Monica Lawson has provided verbal consent on 12/31/2020 for a virtual visit (video or telephone).   [name], PA-C 12/31/2020  10:54 AM

## 2021-02-27 ENCOUNTER — Ambulatory Visit: Payer: Self-pay | Admitting: Nurse Practitioner

## 2021-03-18 ENCOUNTER — Encounter: Payer: Self-pay | Admitting: Nurse Practitioner

## 2021-03-18 ENCOUNTER — Ambulatory Visit: Payer: 59 | Admitting: Nurse Practitioner

## 2021-03-18 ENCOUNTER — Other Ambulatory Visit: Payer: Self-pay

## 2021-03-18 VITALS — BP 110/70 | Ht 62.0 in | Wt 110.0 lb

## 2021-03-18 DIAGNOSIS — Z01419 Encounter for gynecological examination (general) (routine) without abnormal findings: Secondary | ICD-10-CM

## 2021-03-18 DIAGNOSIS — N941 Unspecified dyspareunia: Secondary | ICD-10-CM

## 2021-03-18 DIAGNOSIS — Z30431 Encounter for routine checking of intrauterine contraceptive device: Secondary | ICD-10-CM

## 2021-03-18 NOTE — Patient Instructions (Signed)
Health Maintenance, Female Adopting a healthy lifestyle and getting preventive care are important in promoting health and wellness. Ask your health care provider about:  The right schedule for you to have regular tests and exams.  Things you can do on your own to prevent diseases and keep yourself healthy. What should I know about diet, weight, and exercise? Eat a healthy diet  Eat a diet that includes plenty of vegetables, fruits, low-fat dairy products, and lean protein.  Do not eat a lot of foods that are high in solid fats, added sugars, or sodium.   Maintain a healthy weight Body mass index (BMI) is used to identify weight problems. It estimates body fat based on height and weight. Your health care provider can help determine your BMI and help you achieve or maintain a healthy weight. Get regular exercise Get regular exercise. This is one of the most important things you can do for your health. Most adults should:  Exercise for at least 150 minutes each week. The exercise should increase your heart rate and make you sweat (moderate-intensity exercise).  Do strengthening exercises at least twice a week. This is in addition to the moderate-intensity exercise.  Spend less time sitting. Even light physical activity can be beneficial. Watch cholesterol and blood lipids Have your blood tested for lipids and cholesterol at 43 years of age, then have this test every 5 years. Have your cholesterol levels checked more often if:  Your lipid or cholesterol levels are high.  You are older than 43 years of age.  You are at high risk for heart disease. What should I know about cancer screening? Depending on your health history and family history, you may need to have cancer screening at various ages. This may include screening for:  Breast cancer.  Cervical cancer.  Colorectal cancer.  Skin cancer.  Lung cancer. What should I know about heart disease, diabetes, and high blood  pressure? Blood pressure and heart disease  High blood pressure causes heart disease and increases the risk of stroke. This is more likely to develop in people who have high blood pressure readings, are of African descent, or are overweight.  Have your blood pressure checked: ? Every 3-5 years if you are 18-39 years of age. ? Every year if you are 40 years old or older. Diabetes Have regular diabetes screenings. This checks your fasting blood sugar level. Have the screening done:  Once every three years after age 40 if you are at a normal weight and have a low risk for diabetes.  More often and at a younger age if you are overweight or have a high risk for diabetes. What should I know about preventing infection? Hepatitis B If you have a higher risk for hepatitis B, you should be screened for this virus. Talk with your health care provider to find out if you are at risk for hepatitis B infection. Hepatitis C Testing is recommended for:  Everyone born from 1945 through 1965.  Anyone with known risk factors for hepatitis C. Sexually transmitted infections (STIs)  Get screened for STIs, including gonorrhea and chlamydia, if: ? You are sexually active and are younger than 43 years of age. ? You are older than 43 years of age and your health care provider tells you that you are at risk for this type of infection. ? Your sexual activity has changed since you were last screened, and you are at increased risk for chlamydia or gonorrhea. Ask your health care provider   if you are at risk.  Ask your health care provider about whether you are at high risk for HIV. Your health care provider may recommend a prescription medicine to help prevent HIV infection. If you choose to take medicine to prevent HIV, you should first get tested for HIV. You should then be tested every 3 months for as long as you are taking the medicine. Pregnancy  If you are about to stop having your period (premenopausal) and  you may become pregnant, seek counseling before you get pregnant.  Take 400 to 800 micrograms (mcg) of folic acid every day if you become pregnant.  Ask for birth control (contraception) if you want to prevent pregnancy. Osteoporosis and menopause Osteoporosis is a disease in which the bones lose minerals and strength with aging. This can result in bone fractures. If you are 65 years old or older, or if you are at risk for osteoporosis and fractures, ask your health care provider if you should:  Be screened for bone loss.  Take a calcium or vitamin D supplement to lower your risk of fractures.  Be given hormone replacement therapy (HRT) to treat symptoms of menopause. Follow these instructions at home: Lifestyle  Do not use any products that contain nicotine or tobacco, such as cigarettes, e-cigarettes, and chewing tobacco. If you need help quitting, ask your health care provider.  Do not use street drugs.  Do not share needles.  Ask your health care provider for help if you need support or information about quitting drugs. Alcohol use  Do not drink alcohol if: ? Your health care provider tells you not to drink. ? You are pregnant, may be pregnant, or are planning to become pregnant.  If you drink alcohol: ? Limit how much you use to 0-1 drink a day. ? Limit intake if you are breastfeeding.  Be aware of how much alcohol is in your drink. In the [place] one drink equals one 12 oz bottle of beer (355 mL), one 5 oz glass of wine (148 mL), or one 1 oz glass of hard liquor (44 mL). General instructions  Schedule regular health, dental, and eye exams.  Stay current with your vaccines.  Tell your health care provider if: ? You often feel depressed. ? You have ever been abused or do not feel safe at home. Summary  Adopting a healthy lifestyle and getting preventive care are important in promoting health and wellness.  Follow your health care provider's instructions about healthy  diet, exercising, and getting tested or screened for diseases.  Follow your health care provider's instructions on monitoring your cholesterol and blood pressure. This information is not intended to replace advice given to you by your health care provider. Make sure you discuss any questions you have with your health care provider. Document Revised: 12/08/2018 Document Reviewed: 12/08/2018 Elsevier Patient Education  2021 Elsevier Inc.  

## 2021-03-18 NOTE — Progress Notes (Signed)
   Monica Lawson 1976/06/20 [id number]   History:  45 y.o. G0 presents for annual exam without GYN complaints. Mirena IUD Monica/2021, occasional spotting. CIN-1 years ago, 2020 ASCUS negative HPV. Normal mammogram history. 2015 rectal cancer, hypothyroidism - endocrinology, dyspareunia - was doing PT through urology but has started pole and arial dance classes that have really helped with her symptoms.   Gynecologic History No LMP recorded. (Menstrual status: IUD).   Contraception/Family planning: IUD  Health Maintenance Last Pap: Monica/12/2019. Results were: normal Last mammogram: Monica/2021. Results were: normal Last colonoscopy: 4 years ago per patient Last Dexa: N/A  Past medical history, past surgical history, family history and social history were all reviewed and documented in the EPIC chart. Monica Lawson diagnosed with breast cancer at age 69. Monica Lawson with history of colon cancer. Married. 45 yo pit/hound mix.   ROS:  A ROS was performed and pertinent positives and negatives are included.  Exam:  Vitals:   03/18/21 1508  BP: 110/70  Weight: 110 lb (49.9 kg)  Height: 5\' 2"  (1.575 m)   Body mass index is 20.12 kg/m.  General appearance:  Normal Thyroid:  Symmetrical, normal in size, without palpable masses or nodularity. Respiratory  Auscultation:  Clear without wheezing or rhonchi Cardiovascular  Auscultation:  Regular rate, without rubs, murmurs or gallops  Edema/varicosities:  Not grossly evident Abdominal  Soft,nontender, without masses, guarding or rebound.  Liver/spleen:  No organomegaly noted  Hernia:  None appreciated  Skin  Inspection:  Grossly normal   Breasts: Examined lying and sitting.   Right: Without masses, retractions, discharge or axillary adenopathy.   Left: Without masses, retractions, discharge or axillary adenopathy. Gentitourinary   Inguinal/mons:  Normal without inguinal adenopathy  External genitalia:  Normal  BUS/Urethra/Skene's glands:   Normal  Vagina:  Normal  Cervix:  Normal, IUD string visible at exocervix  Uterus:  Normal in size, shape and contour.  Midline and mobile  Adnexa/parametria:     Rt: Without masses or tenderness.   Lt: Without masses or tenderness.  Anus and perineum: Normal  Digital rectal exam: Normal sphincter tone without palpated masses or tenderness  Assessment/Plan:  45 y.o. G0 for annual exam.   Well female exam with routine gynecological exam - Education provided on SBEs, importance of preventative screenings, current guidelines, high calcium diet, regular exercise, and multivitamin daily. Labs done elsewhere.  Encounter for routine checking of intrauterine contraceptive device (IUD) - Mirena inserted Monica/2021, occasional spotting.   Dyspareunia in female -  was doing PT through urology but has started pole and arial dance classes that have really helped with her symptoms.   Screening for cervical cancer - CIN-1 many years ago, subsequent paps normal. Will repeat at Monica-year interval per guidelines.  Screening for breast cancer - Normal mammogram history.  Continue annual screenings.  Normal breast exam today. Monica Lawson diagnosed with breast cancer at age 37.   Screening for colon cancer - colonoscopy 4 years ago per patient. Will repeat at GI's recommended interval. History of rectal cancer in 2015. Monica Lawson with history of colon cancer .  Return in 1 year for annual.       Monica Gammon DNP, Monica:23 PM Monica/21/2022

## 2021-06-18 ENCOUNTER — Other Ambulatory Visit: Payer: Self-pay | Admitting: Physician Assistant

## 2021-07-18 ENCOUNTER — Other Ambulatory Visit: Payer: Self-pay | Admitting: Physician Assistant

## 2021-07-19 NOTE — Telephone Encounter (Signed)
Last filled 6/28

## 2021-08-29 ENCOUNTER — Other Ambulatory Visit: Payer: Self-pay | Admitting: Physician Assistant

## 2021-08-29 NOTE — Telephone Encounter (Signed)
Last filled 7/25 appt on 10/11

## 2021-09-19 ENCOUNTER — Other Ambulatory Visit: Payer: Self-pay | Admitting: Physician Assistant

## 2021-10-08 ENCOUNTER — Ambulatory Visit (INDEPENDENT_AMBULATORY_CARE_PROVIDER_SITE_OTHER): Payer: 59 | Admitting: Physician Assistant

## 2021-10-08 ENCOUNTER — Encounter: Payer: Self-pay | Admitting: Physician Assistant

## 2021-10-08 ENCOUNTER — Other Ambulatory Visit: Payer: Self-pay

## 2021-10-08 DIAGNOSIS — F411 Generalized anxiety disorder: Secondary | ICD-10-CM | POA: Diagnosis not present

## 2021-10-08 DIAGNOSIS — F422 Mixed obsessional thoughts and acts: Secondary | ICD-10-CM | POA: Diagnosis not present

## 2021-10-08 MED ORDER — ALPRAZOLAM 1 MG PO TABS: 0.5000 mg | ORAL_TABLET | Freq: Two times a day (BID) | ORAL | 5 refills | Status: DC | PRN

## 2021-10-08 NOTE — Progress Notes (Signed)
[facility]  Patient ID: Monica Lawson,  MRN: [id number]  PCP: Simona Huh, NP  Date of Evaluation: 10/08/2021 Time spent:20 minutes  Chief Complaint:  Chief Complaint   Anxiety; Follow-up     HISTORY/CURRENT STATUS: For routine med check.   Has had a rough day today, late to appt,, traffic was bad.  But overall she is doing really well.  She is still exercising routinely which makes her feel good.  Work is going well and she stays busy.  She does still need the Xanax.  It is effective.  She has generalized anxiety more so than panic attacks.  OCD symptoms are controlled.  She is able to enjoy things.  Energy and motivation are good.  She still exercises daily.  Personal hygiene is normal.  Not isolating.  Not crying easily.  No suicidal or homicidal thoughts.  Patient denies increased energy with decreased need for sleep, no increased talkativeness, no racing thoughts, no impulsivity or risky behaviors, no increased spending, no increased libido, no grandiosity, no increased irritability or anger, and no hallucinations.  Denies dizziness, syncope, seizures, numbness, tingling, tremor, tics, unsteady gait, slurred speech, confusion.  Denies muscle or joint pain, stiffness, or dystonia.  Individual Medical History/ Review of Systems: Changes? :No    Past medications for mental health diagnoses include: Zoloft, Xanax, trazodone, BuSpar, Lexapro,  Allergies: Blue dyes (parenteral), Corn-containing products, Lactose intolerance (gi), Other, Red dye, Yeast-related products, Ciprofloxacin in d5w, Soy allergy, and Wheat bran  Current Medications:  Current Outpatient Medications:    Calcium Carbonate-Vitamin D (CALCIUM + D PO), Take 1 tablet by mouth 2 (two) times daily., Disp: , Rfl:    diphenhydrAMINE (BENADRYL) 25 mg capsule, Take 25 mg by mouth every 6 (six) hours as needed for allergies., Disp: , Rfl:    levothyroxine (SYNTHROID) 50 MCG tablet, Take 75 mcg by mouth  daily before breakfast., Disp: , Rfl:    Lysine 1000 MG TABS, Take 1,000 mg by mouth daily. , Disp: , Rfl:    Multiple Vitamin (MULTIVITAMIN) tablet, Take 1 tablet by mouth 2 (two) times daily., Disp: , Rfl:    Multiple Vitamins-Minerals (ZINC PO), Take 1 tablet by mouth daily., Disp: , Rfl:    sertraline (ZOLOFT) 100 MG tablet, TAKE 3 TABLETS BY MOUTH DAILY, Disp: 270 tablet, Rfl: 0   spironolactone (ALDACTONE) 25 MG tablet, Take 25 mg by mouth daily., Disp: , Rfl: 2   Turmeric 450 MG CAPS, Take by mouth., Disp: , Rfl:    valACYclovir (VALTREX) 500 MG tablet, Take 1 tablet (500 mg total) by mouth daily as needed (coldsores)., Disp: 90 tablet, Rfl: 4   ALPRAZolam (XANAX) 1 MG tablet, Take 0.5-1 tablets (0.5-1 mg total) by mouth 2 (two) times daily as needed. for anxiety, Disp: 60 tablet, Rfl: 5  Current Facility-Administered Medications:    levonorgestrel (MIRENA) 20 MCG/24HR IUD, , Intrauterine, Once, Fontaine, Belinda Block, MD Medication Side Effects: none  Family Medical/ Social History: Changes? Job move to [place] EXAM:  There were no vitals taken for this visit.There is no height or weight on file to calculate BMI.  General Appearance: Casual and Well Groomed  Eye Contact:  Good  Speech:  Clear and Coherent and Normal Rate  Volume:  Normal  Mood:  Euthymic  Affect:  Appropriate  Thought Process:  Goal Directed and Descriptions of Associations: Intact  Orientation:  Full (Time, Place, and Person)  Thought Content: Logical   Suicidal Thoughts:  No  Homicidal Thoughts:  No  Memory:  WNL  Judgement:  Good  Insight:  Good  Psychomotor Activity:  Normal  Concentration:  Concentration: Good and Attention Span: Good  Recall:  Good  Fund of Knowledge: Good  Language: Good  Assets:  Desire for Improvement  ADL's:  Intact  Cognition: WNL  Prognosis:  Good    DIAGNOSES:    ICD-10-CM   1. Mixed obsessional thoughts and acts  F42.2     2. Generalized anxiety  disorder  F41.1        Receiving Psychotherapy: Yes    Rosary Lively in the past.   RECOMMENDATIONS:  PDMP reviewed.  08/29/2021 Xanax filled. I provided 20 minutes of face to face time during this encounter, including time spent before and after the visit in records review, medical decision making, and charting.  Doing well so no changes.  Continue Zoloft 100 mg, 3 p.o. daily. Continue Xanax dose changed to 1 mg, 1/2-1 p.o. twice daily as needed.   Return in 6 months.  Donnal Moat, PA-C

## 2021-12-18 ENCOUNTER — Other Ambulatory Visit: Payer: Self-pay | Admitting: Physician Assistant

## 2022-03-15 ENCOUNTER — Other Ambulatory Visit: Payer: Self-pay | Admitting: Physician Assistant

## 2022-03-19 ENCOUNTER — Ambulatory Visit: Payer: 59 | Admitting: Nurse Practitioner

## 2022-03-20 ENCOUNTER — Other Ambulatory Visit: Payer: Self-pay

## 2022-03-20 ENCOUNTER — Ambulatory Visit (INDEPENDENT_AMBULATORY_CARE_PROVIDER_SITE_OTHER): Payer: 59 | Admitting: Nurse Practitioner

## 2022-03-20 ENCOUNTER — Encounter: Payer: Self-pay | Admitting: Nurse Practitioner

## 2022-03-20 VITALS — BP 116/76 | Ht 63.0 in | Wt 110.0 lb

## 2022-03-20 DIAGNOSIS — Z01419 Encounter for gynecological examination (general) (routine) without abnormal findings: Secondary | ICD-10-CM

## 2022-03-20 DIAGNOSIS — Z30431 Encounter for routine checking of intrauterine contraceptive device: Secondary | ICD-10-CM

## 2022-03-20 NOTE — Progress Notes (Signed)
? ?  Monica Lawson November 02, 1976 [id number] ? ? ?History:  46 y.o. G0 presents for annual exam without GYN complaints. Mirena IUD 02/2020, occasional spotting. CIN-1 years ago, 2020 ASCUS negative HPV. Normal mammogram history. 2015 rectal cancer. Hypothyroidism managed by endocrinology, GAD managed by [facility].  ? ?Gynecologic History ?No LMP recorded. (Menstrual status: IUD). ?  ?Contraception/Family planning: IUD ?Sexually active: Yes ? ?Health Maintenance ?Last Pap: 02/27/2020. Results were: Normal, 3-year repeat ?Last mammogram: 05/2021. Results were: Normal ?Last colonoscopy: 5 years ago per patient ?Last Dexa: Not indicated ? ?Past medical history, past surgical history, family history and social history were all reviewed and documented in the EPIC chart. Married. 44 yo pit/hound mix. Mother diagnosed with breast cancer at age 47. 3 grandparents with history of colon cancer.  ? ?ROS:  A ROS was performed and pertinent positives and negatives are included. ? ?Exam: ? ?Vitals:  ? 03/20/22 1410  ?BP: 116/76  ?Weight: 110 lb (49.9 kg)  ?Height: '5\' 3"'$  (1.6 m)  ? ? ?Body mass index is 19.49 kg/m?. ? ?General appearance:  Normal ?Thyroid:  Symmetrical, normal in size, without palpable masses or nodularity. ?Respiratory ? Auscultation:  Clear without wheezing or rhonchi ?Cardiovascular ? Auscultation:  Regular rate, without rubs, murmurs or gallops ? Edema/varicosities:  Not grossly evident ?Abdominal ? Soft,nontender, without masses, guarding or rebound. ? Liver/spleen:  No organomegaly noted ? Hernia:  None appreciated ? Skin ? Inspection:  Grossly normal ?  ?Breasts: Examined lying and sitting.  ? Right: Without masses, retractions, discharge or axillary adenopathy. ? ? Left: Without masses, retractions, discharge or axillary adenopathy. ?Genitourinary  ? Inguinal/mons:  Normal without inguinal adenopathy ? External genitalia:  Normal appearing vulva with no masses, tenderness, or lesions ? BUS/Urethra/Skene's glands:   Normal ? Vagina:  Normal appearing with normal color and discharge, no lesions ? Cervix:  Normal appearing without discharge or lesions. IUD string visible ? Uterus:  Normal in size, shape and contour.  Midline and mobile, nontender ? Adnexa/parametria:   ?  Rt: Normal in size, without masses or tenderness. ?  Lt: Normal in size, without masses or tenderness. ? Anus and perineum: Normal ? Digital rectal exam: Normal sphincter tone without palpated masses or tenderness ? ?Patient informed chaperone available to be present for breast and pelvic exam. Patient has requested no chaperone to be present. Patient has been advised what will be completed during breast and pelvic exam.  ? ?Assessment/Plan:  46 y.o. G0 for annual exam.  ? ?Well female exam with routine gynecological exam - Education provided on SBEs, importance of preventative screenings, current guidelines, high calcium diet, regular exercise, and multivitamin daily. Labs done with PCP.  ? ?Encounter for routine checking of intrauterine contraceptive device (IUD) - Mirena IUD inserted 02/2020. Occasional spotting. Aware of 8-year FDA approval.  ? ?Screening for cervical cancer - CIN-1 many years ago, subsequent paps normal. Will repeat at 3-year interval per guidelines. ? ?Screening for breast cancer - Normal mammogram history.  Continue annual screenings.  Normal breast exam today. Mother diagnosed with breast cancer at age 43.  ? ?Screening for colon cancer - colonoscopy 4-5 years ago per patient. Will repeat at GI's recommended interval. History of rectal cancer in 2015. 3 grandparents with history of colon cancer . ? ?Return in 1 year for annual.  ? ? ? ? ? ?[name] DNP, 2:42 PM 03/20/2022 ? ?

## 2022-04-09 ENCOUNTER — Encounter: Payer: Self-pay | Admitting: Physician Assistant

## 2022-04-09 ENCOUNTER — Ambulatory Visit: Payer: 59 | Admitting: Physician Assistant

## 2022-04-09 DIAGNOSIS — F411 Generalized anxiety disorder: Secondary | ICD-10-CM

## 2022-04-09 DIAGNOSIS — F422 Mixed obsessional thoughts and acts: Secondary | ICD-10-CM

## 2022-04-09 MED ORDER — ALPRAZOLAM 1 MG PO TABS: 0.5000 mg | ORAL_TABLET | Freq: Two times a day (BID) | ORAL | 5 refills | Status: DC | PRN

## 2022-04-09 NOTE — Progress Notes (Signed)
[facility] ? ?Patient ID: Monica Lawson,  ?MRN: [id number] ? ?PCP: Simona Huh, NP ? ?Date of Evaluation: 04/09/2022 ?Time spent:20 minutes ? ?Chief Complaint:  ?Chief Complaint   ?Anxiety; Depression; Follow-up ?  ? ? ?HISTORY/CURRENT STATUS: ?For routine med check.  ? ?Doing well w/ psychiatric meds. Patient denies loss of interest in usual activities and is able to enjoy things. Still exercises doing ariels and loves it. Works is going well. Denies decreased energy or motivation.  Appetite has not changed.  No extreme sadness, tearfulness, or feelings of hopelessness.  Denies any changes in concentration, making decisions or remembering things.  Denies suicidal or homicidal thoughts. ? ?Anxiety is well controlled w/ Xanax.  She does not typically obsess about things or have compulsions or the tics that she had when the OCD was really bad.  If she is under a lot of stress at work or what ever it can get worse, finds things in patterns four. ? ?Patient denies increased energy with decreased need for sleep, no increased talkativeness, no racing thoughts, no impulsivity or risky behaviors, no increased spending, no increased libido, no grandiosity, no increased irritability or anger, and no hallucinations. ? ?Denies dizziness, syncope, seizures, numbness, tingling, tremor, tics, unsteady gait, slurred speech, confusion.  Denies muscle or joint pain, stiffness, or dystonia. ? ?Individual Medical History/ Review of Systems: Changes? :No   ? ?Past medications for mental health diagnoses include: ?Zoloft, Xanax, trazodone, BuSpar, Lexapro, ? ?Allergies: Blue dyes (parenteral), Corn-containing products, Lactose intolerance (gi), Other, Red dye, Yeast-related products, Ciprofloxacin in d5w, Soy allergy, and Wheat bran ? ?Current Medications:  ?Current Outpatient Medications:  ?  Calcium Carbonate-Vitamin D (CALCIUM + D PO), Take 1 tablet by mouth 2 (two) times daily., Disp: , Rfl:  ?  diphenhydrAMINE  (BENADRYL) 25 mg capsule, Take 25 mg by mouth every 6 (six) hours as needed for allergies., Disp: , Rfl:  ?  levothyroxine (SYNTHROID) 50 MCG tablet, Take 75 mcg by mouth daily before breakfast., Disp: , Rfl:  ?  Lysine 1000 MG TABS, Take 1,000 mg by mouth daily. , Disp: , Rfl:  ?  Multiple Vitamin (MULTIVITAMIN) tablet, Take 1 tablet by mouth 2 (two) times daily., Disp: , Rfl:  ?  Multiple Vitamins-Minerals (ZINC PO), Take 1 tablet by mouth daily., Disp: , Rfl:  ?  sertraline (ZOLOFT) 100 MG tablet, TAKE 3 TABLETS BY MOUTH DAILY, Disp: 270 tablet, Rfl: 0 ?  spironolactone (ALDACTONE) 25 MG tablet, Take 25 mg by mouth daily., Disp: , Rfl: 2 ?  Turmeric 450 MG CAPS, Take by mouth., Disp: , Rfl:  ?  valACYclovir (VALTREX) 500 MG tablet, Take 1 tablet (500 mg total) by mouth daily as needed (coldsores)., Disp: 90 tablet, Rfl: 4 ?  ALPRAZolam (XANAX) 1 MG tablet, Take 0.5-1 tablets (0.5-1 mg total) by mouth 2 (two) times daily as needed. for anxiety, Disp: 60 tablet, Rfl: 5 ? ?Current Facility-Administered Medications:  ?  levonorgestrel (MIRENA) 20 MCG/24HR IUD, , Intrauterine, Once, Fontaine, Belinda Block, MD ?Medication Side Effects: none ? ?Family Medical/ Social History: Changes? No ? ?MENTAL HEALTH EXAM: ? ?There were no vitals taken for this visit.There is no height or weight on file to calculate BMI.  ?General Appearance: Casual and Well Groomed  ?Eye Contact:  Good  ?Speech:  Clear and Coherent and Normal Rate  ?Volume:  Normal  ?Mood:  Euthymic  ?Affect:  Appropriate  ?Thought Process:  Goal Directed and Descriptions of Associations: Intact  ?Orientation:  Full (Time, Place, and Person)  ?Thought Content: Logical   ?Suicidal Thoughts:  No  ?Homicidal Thoughts:  No  ?Memory:  WNL  ?Judgement:  Good  ?Insight:  Good  ?Psychomotor Activity:  Normal  ?Concentration:  Concentration: Good and Attention Span: Good  ?Recall:  Good  ?Fund of Knowledge: Good  ?Language: Good  ?Assets:  Desire for Improvement  ?ADL's:   Intact  ?Cognition: WNL  ?Prognosis:  Good  ? ? ?DIAGNOSES:  ?  ICD-10-CM   ?1. Mixed obsessional thoughts and acts  F42.2   ?  ?2. Generalized anxiety disorder  F41.1   ?  ? ? ?Receiving Psychotherapy: No    ? ?RECOMMENDATIONS:  ?PDMP reviewed. Xanax filled 03/19/2022.  ?I provided 20 minutes of face to face time during this encounter, including time spent before and after the visit in records review, medical decision making, counseling pertinent to today's visit, and charting.  ?She is doing well so no changes need to be made. ? ?Continue Zoloft 100 mg, 3 p.o. daily. ?Continue Xanax dose changed to 1 mg, 1/2-1 p.o. twice daily as needed.   ?Return in 6 months. ? ?Donnal Moat, PA-C  ? ? ?

## 2022-06-11 ENCOUNTER — Other Ambulatory Visit: Payer: Self-pay | Admitting: Psychiatry

## 2022-06-25 ENCOUNTER — Other Ambulatory Visit: Payer: Self-pay | Admitting: Radiology

## 2022-09-08 ENCOUNTER — Other Ambulatory Visit: Payer: Self-pay | Admitting: Psychiatry

## 2022-10-09 ENCOUNTER — Ambulatory Visit: Payer: 59 | Admitting: Physician Assistant

## 2022-10-14 ENCOUNTER — Encounter: Payer: Self-pay | Admitting: Physician Assistant

## 2022-10-14 ENCOUNTER — Ambulatory Visit: Payer: 59 | Admitting: Physician Assistant

## 2022-10-14 DIAGNOSIS — F411 Generalized anxiety disorder: Secondary | ICD-10-CM | POA: Diagnosis not present

## 2022-10-14 DIAGNOSIS — F422 Mixed obsessional thoughts and acts: Secondary | ICD-10-CM | POA: Diagnosis not present

## 2022-10-14 MED ORDER — ALPRAZOLAM 1 MG PO TABS: 0.5000 mg | ORAL_TABLET | Freq: Two times a day (BID) | ORAL | 5 refills | Status: DC | PRN

## 2022-10-14 NOTE — Progress Notes (Signed)
[facility]  Patient ID: Monica Lawson,  MRN: [id number]  PCP: Simona Huh, NP  Date of Evaluation: 10/14/2022 Time spent:20 minutes  Chief Complaint:  Chief Complaint   Anxiety; Depression; Follow-up    HISTORY/CURRENT STATUS: For routine med check.   Has been more moody lately. But feels that some of it is due to the state of the world and current events. If it wasn't for that, she'd be doing well.  Energy and motivation are good most of the time.  Work is going well.   No extreme sadness, tearfulness, or feelings of hopelessness.  Sleeps well most of the time. ADLs and personal hygiene are normal.   Denies any changes in concentration, making decisions, or remembering things.  Appetite has not changed.  Weight is stable. Anxiety can be a problem, d/t situational things. Xanax helps a lot.  Not obsessing about things. Denies suicidal or homicidal thoughts.  Patient denies increased energy with decreased need for sleep, increased talkativeness, racing thoughts, impulsivity or risky behaviors, increased spending, increased libido, grandiosity, increased irritability or anger, paranoia, or hallucinations.  Denies dizziness, syncope, seizures, numbness, tingling, tremor, tics, unsteady gait, slurred speech, confusion.  Denies muscle or joint pain, stiffness, or dystonia.  Individual Medical History/ Review of Systems: Changes? :No    Past medications for mental health diagnoses include: Zoloft, Xanax, trazodone, BuSpar, Lexapro,  Allergies: Blue dyes (parenteral), Corn-containing products, Lactose intolerance (gi), Other, Red dye, Yeast-related products, Ciprofloxacin in d5w, Soy allergy, and Wheat bran  Current Medications:  Current Outpatient Medications:    Calcium Carbonate-Vitamin D (CALCIUM + D PO), Take 1 tablet by mouth 2 (two) times daily., Disp: , Rfl:    diphenhydrAMINE (BENADRYL) 25 mg capsule, Take 25 mg by mouth every 6 (six) hours as needed for allergies.,  Disp: , Rfl:    levothyroxine (SYNTHROID) 50 MCG tablet, Take 75 mcg by mouth daily before breakfast., Disp: , Rfl:    Lysine 1000 MG TABS, Take 1,000 mg by mouth daily. , Disp: , Rfl:    Multiple Vitamin (MULTIVITAMIN) tablet, Take 1 tablet by mouth 2 (two) times daily., Disp: , Rfl:    Multiple Vitamins-Minerals (ZINC PO), Take 1 tablet by mouth daily., Disp: , Rfl:    sertraline (ZOLOFT) 100 MG tablet, TAKE 3 TABLETS BY MOUTH DAILY, Disp: 270 tablet, Rfl: 0   spironolactone (ALDACTONE) 25 MG tablet, Take 25 mg by mouth daily., Disp: , Rfl: 2   Turmeric 450 MG CAPS, Take by mouth., Disp: , Rfl:    valACYclovir (VALTREX) 500 MG tablet, Take 1 tablet (500 mg total) by mouth daily as needed (coldsores)., Disp: 90 tablet, Rfl: 4   ALPRAZolam (XANAX) 1 MG tablet, Take 0.5-1 tablets (0.5-1 mg total) by mouth 2 (two) times daily as needed. for anxiety, Disp: 60 tablet, Rfl: 5  Current Facility-Administered Medications:    levonorgestrel (MIRENA) 20 MCG/24HR IUD, , Intrauterine, Once, Fontaine, Belinda Block, MD Medication Side Effects: none  Family Medical/ Social History: Changes? No  MENTAL HEALTH EXAM:  There were no vitals taken for this visit.There is no height or weight on file to calculate BMI.  General Appearance: Casual and Well Groomed  Eye Contact:  Good  Speech:  Clear and Coherent and Normal Rate  Volume:  Normal  Mood:  Anxious  Affect:  Congruent  Thought Process:  Goal Directed and Descriptions of Associations: Intact  Orientation:  Full (Time, Place, and Person)  Thought Content: Logical   Suicidal Thoughts:  No  Homicidal Thoughts:  No  Memory:  WNL  Judgement:  Good  Insight:  Good  Psychomotor Activity:  Normal  Concentration:  Concentration: Good and Attention Span: Good  Recall:  Good  Fund of Knowledge: Good  Language: Good  Assets:  Desire for Improvement  ADL's:  Intact  Cognition: WNL  Prognosis:  Good   DIAGNOSES:    ICD-10-CM   1. Mixed obsessional  thoughts and acts  F42.2     2. Generalized anxiety disorder  F41.1       Receiving Psychotherapy: No     RECOMMENDATIONS:  PDMP reviewed. Xanax filled 10/03/2022. I provided 20 minutes of face to face time during this encounter, including time spent before and after the visit in records review, medical decision making, counseling pertinent to today's visit, and charting.   She's tried extra B vitamins in the past and they cause acne, so she tries to eat well enough to get the B vitamins nutrionally.  We agree to make no changes right now. She feels like the moodiness is circumstantial and hopefully will improve soon,   Continue Xanax 1 mg, 1/2-1 bid prn. Continue Zoloft 100 mg, 2 qd.  Return in 3 months.   Donnal Moat, PA-C

## 2022-12-05 ENCOUNTER — Other Ambulatory Visit: Payer: Self-pay | Admitting: Physician Assistant

## 2022-12-05 NOTE — Telephone Encounter (Signed)
Last note shows 2 tabs. Called and LVM to verify.

## 2022-12-09 NOTE — Telephone Encounter (Signed)
Called patient again and she said she is taking 300 mg.

## 2023-01-14 ENCOUNTER — Ambulatory Visit: Payer: 59 | Admitting: Physician Assistant

## 2023-01-14 ENCOUNTER — Encounter: Payer: Self-pay | Admitting: Physician Assistant

## 2023-01-14 DIAGNOSIS — F422 Mixed obsessional thoughts and acts: Secondary | ICD-10-CM | POA: Diagnosis not present

## 2023-01-14 DIAGNOSIS — F411 Generalized anxiety disorder: Secondary | ICD-10-CM

## 2023-01-14 NOTE — Progress Notes (Signed)
[facility]  Patient ID: Monica Lawson,  MRN: [id number]  PCP: Simona Huh, NP  Date of Evaluation: 01/14/2023 Time spent:20 minutes  Chief Complaint:  Chief Complaint   Anxiety; Depression; Follow-up     HISTORY/CURRENT STATUS: For routine med check.   Doing well for the most part. Feels that any time she gets down or anxious is circumstantial. Not having PA but gets overwhelmed and needs the Xanax.  Doesn't obsess much and feels that current meds are working well.    Patient is able to enjoy things.  Energy and motivation are good.  Work is going well.   No extreme sadness, tearfulness, or feelings of hopelessness.  Sleeps well most of the time. ADLs and personal hygiene are normal.   Denies any changes in concentration, making decisions, or remembering things.  Appetite has not changed.  Weight is stable.  Denies suicidal or homicidal thoughts.  Patient denies increased energy with decreased need for sleep, increased talkativeness, racing thoughts, impulsivity or risky behaviors, increased spending, increased libido, grandiosity, increased irritability or anger, paranoia, or hallucinations.  Patient denies increased energy with decreased need for sleep, increased talkativeness, racing thoughts, impulsivity or risky behaviors, increased spending, increased libido, grandiosity, increased irritability or anger, paranoia, or hallucinations.  Denies dizziness, syncope, seizures, numbness, tingling, tremor, tics, unsteady gait, slurred speech, confusion.  Denies muscle or joint pain, stiffness, or dystonia.  Individual Medical History/ Review of Systems: Changes? :No    Past medications for mental health diagnoses include: Zoloft, Xanax, trazodone, BuSpar, Lexapro,  Allergies: Blue dyes (parenteral), Corn-containing products, Lactose intolerance (gi), Other, Red dye, Yeast-related products, Ciprofloxacin in d5w, Soy allergy, and Wheat bran  Current Medications:  Current  Outpatient Medications:    ALPRAZolam (XANAX) 1 MG tablet, Take 0.5-1 tablets (0.5-1 mg total) by mouth 2 (two) times daily as needed. for anxiety, Disp: 60 tablet, Rfl: 5   Calcium Carbonate-Vitamin D (CALCIUM + D PO), Take 1 tablet by mouth 2 (two) times daily., Disp: , Rfl:    diphenhydrAMINE (BENADRYL) 25 mg capsule, Take 25 mg by mouth every 6 (six) hours as needed for allergies., Disp: , Rfl:    levothyroxine (SYNTHROID) 50 MCG tablet, Take 75 mcg by mouth daily before breakfast., Disp: , Rfl:    Lysine 1000 MG TABS, Take 1,000 mg by mouth daily. , Disp: , Rfl:    Multiple Vitamin (MULTIVITAMIN) tablet, Take 1 tablet by mouth 2 (two) times daily., Disp: , Rfl:    Multiple Vitamins-Minerals (ZINC PO), Take 1 tablet by mouth daily., Disp: , Rfl:    sertraline (ZOLOFT) 100 MG tablet, TAKE 3 TABLETS BY MOUTH DAILY, Disp: 270 tablet, Rfl: 0   spironolactone (ALDACTONE) 25 MG tablet, Take 25 mg by mouth daily., Disp: , Rfl: 2   Turmeric 450 MG CAPS, Take by mouth., Disp: , Rfl:    valACYclovir (VALTREX) 500 MG tablet, Take 1 tablet (500 mg total) by mouth daily as needed (coldsores)., Disp: 90 tablet, Rfl: 4  Current Facility-Administered Medications:    levonorgestrel (MIRENA) 20 MCG/24HR IUD, , Intrauterine, Once, Fontaine, Belinda Block, MD Medication Side Effects: none  Family Medical/ Social History: Changes? No  MENTAL HEALTH EXAM:  There were no vitals taken for this visit.There is no height or weight on file to calculate BMI.  General Appearance: Casual and Well Groomed  Eye Contact:  Good  Speech:  Clear and Coherent and Normal Rate  Volume:  Normal  Mood:  Euthymic  Affect:  Congruent  Thought Process:  Goal Directed and Descriptions of Associations: Intact  Orientation:  Full (Time, Place, and Person)  Thought Content: Logical   Suicidal Thoughts:  No  Homicidal Thoughts:  No  Memory:  WNL  Judgement:  Good  Insight:  Good  Psychomotor Activity:  Normal  Concentration:   Concentration: Good and Attention Span: Good  Recall:  Good  Fund of Knowledge: Good  Language: Good  Assets:  Desire for Improvement  ADL's:  Intact  Cognition: WNL  Prognosis:  Good   DIAGNOSES:    ICD-10-CM   1. Mixed obsessional thoughts and acts  F42.2     2. Generalized anxiety disorder  F41.1       Receiving Psychotherapy: No     RECOMMENDATIONS:  PDMP reviewed. Xanax filled 01/06/2023. I provided 20 minutes of face to face time during this encounter, including time spent before and after the visit in records review, medical decision making, counseling pertinent to today's visit, and charting.   Doing well so no changes needed.  Continue healthy lifestyle including healthy diet, exercise.   Continue Xanax 1 mg, 1/2-1 bid prn. Continue Zoloft 100 mg, 2 qd.  Return in 3 months.   Donnal Moat, PA-C

## 2023-03-12 ENCOUNTER — Other Ambulatory Visit: Payer: Self-pay | Admitting: Physician Assistant

## 2023-03-19 ENCOUNTER — Telehealth: Payer: Self-pay | Admitting: Physician Assistant

## 2023-03-19 MED ORDER — SERTRALINE HCL 100 MG PO TABS: 300.0000 mg | ORAL_TABLET | Freq: Every day | ORAL | 0 refills | Status: DC

## 2023-03-19 NOTE — Telephone Encounter (Signed)
Patient called in [place] that a prescription for Setraline  was sent to pharmacy and when she picked it up she realized that it was written for an incorrect dosage. States that she normally takes them three times a day and prescription sent was for 60 tablets twice a day. Please rtc to patient. Ph: [place] [facility] [id number] [place]

## 2023-03-19 NOTE — Telephone Encounter (Signed)
Sent in a corrected Rx, but patient had already picked up the 200 mg one.  Made note on Rx that it was a correction and to fill when requested. Told patient to just take 3 of current Rx and when she runs out she can get RF.

## 2023-03-19 NOTE — Telephone Encounter (Signed)
From last note:  Continue Zoloft 100 mg, 2 qd.   I sent in RF based on this. Patient called today to say that she takes TID.  Looking in her medication history it is showing she has been on TID dosing for a long time. Please advise.

## 2023-03-19 NOTE — Telephone Encounter (Signed)
I'm sorry, my mistake. Yes it should be 3 po (300 mg daily) Ok to send in new Rx for 90 or 270 if she wants a 3 month supply. Thank you.

## 2023-03-23 ENCOUNTER — Other Ambulatory Visit (HOSPITAL_COMMUNITY)
Admission: RE | Admit: 2023-03-23 | Discharge: 2023-03-23 | Disposition: A | Discharge status: Home / Self Care | Payer: 59 | Source: Ambulatory Visit | Attending: Nurse Practitioner | Admitting: Nurse Practitioner

## 2023-03-23 ENCOUNTER — Ambulatory Visit (INDEPENDENT_AMBULATORY_CARE_PROVIDER_SITE_OTHER): Payer: 59 | Admitting: Nurse Practitioner

## 2023-03-23 ENCOUNTER — Encounter: Payer: Self-pay | Admitting: Nurse Practitioner

## 2023-03-23 VITALS — BP 110/70 | HR 68 | Ht 61.0 in | Wt 114.0 lb

## 2023-03-23 DIAGNOSIS — Z124 Encounter for screening for malignant neoplasm of cervix: Secondary | ICD-10-CM

## 2023-03-23 DIAGNOSIS — Z01419 Encounter for gynecological examination (general) (routine) without abnormal findings: Secondary | ICD-10-CM

## 2023-03-23 DIAGNOSIS — Z30431 Encounter for routine checking of intrauterine contraceptive device: Secondary | ICD-10-CM

## 2023-03-23 NOTE — Progress Notes (Signed)
   Monica Lawson 1976-12-29 [id number]   History:  47 y.o. G0 presents for annual exam without GYN complaints. Mirena IUD 02/2020, occasional spotting. CIN-1 years ago, 2020 ASCUS negative HPV. Benign breast biopsy last year. 2015 rectal cancer. Hypothyroidism managed by endocrinology, GAD managed by [facility].   Gynecologic History No LMP recorded. (Menstrual status: IUD).   Contraception/Family planning: IUD Sexually active: Yes  Health Maintenance Last Pap: 02/27/2020. Results were: Normal, 3-year repeat Last mammogram: 2023. Results were: Normal per patient Last colonoscopy: 07/17/2022. Results were: Large polyp, 3-year recall Last Dexa: Not indicated  Past medical history, past surgical history, family history and social history were all reviewed and documented in the EPIC chart. Married. 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 Husband does Psychologist, sport and exercise. 45 yo pit/hound mix. Mother diagnosed with breast cancer at age 37. 3 grandparents with history of colon cancer.   ROS:  A ROS was performed and pertinent positives and negatives are included.  Exam:  Vitals:   03/23/23 1407  BP: 110/70  Pulse: 68  SpO2: 100%  Weight: 114 lb (51.7 kg)  Height: 5\' 1"  (1.549 m)     Body mass index is 21.54 kg/m.  General appearance:  Normal Thyroid:  Symmetrical, normal in size, without palpable masses or nodularity. Respiratory  Auscultation:  Clear without wheezing or rhonchi Cardiovascular  Auscultation:  Regular rate, without rubs, murmurs or gallops  Edema/varicosities:  Not grossly evident Abdominal  Soft,nontender, without masses, guarding or rebound.  Liver/spleen:  No organomegaly noted  Hernia:  None appreciated  Skin  Inspection:  Grossly normal   Breasts: Examined lying and sitting.   Right: Without masses, retractions, discharge or axillary adenopathy.   Left: Without masses, retractions, discharge or axillary adenopathy. Genitourinary   Inguinal/mons:  Normal without  inguinal adenopathy  External genitalia:  Normal appearing vulva with no masses, tenderness, or lesions  BUS/Urethra/Skene's glands:  Normal  Vagina:  Normal appearing with normal color and discharge, no lesions  Cervix:  Normal appearing without discharge or lesions. IUD string visible  Uterus:  Normal in size, shape and contour.  Midline and mobile, nontender  Adnexa/parametria:     Rt: Normal in size, without masses or tenderness.   Lt: Normal in size, without masses or tenderness.  Anus and perineum: Normal  Digital rectal exam: Deferred  Patient informed chaperone available to be present for breast and pelvic exam. Patient has requested no chaperone to be present. Patient has been advised what will be completed during breast and pelvic exam.   Assessment/Plan:  47 y.o. G0 for annual exam.   Well female exam with routine gynecological exam - Education provided on SBEs, importance of preventative screenings, current guidelines, high calcium diet, regular exercise, and multivitamin daily. Labs done with PCP.   Encounter for routine checking of intrauterine contraceptive device (IUD) - Mirena IUD inserted 02/2020. Occasional spotting. Aware of 8-year FDA approval.   Screening for cervical cancer - Plan: Cytology - PAP( [facility]). CIN-1 many years ago, subsequent paps normal.   Screening for breast cancer - Benign breast biopsy last summer. Normal breast exam today. Mother diagnosed with breast cancer at age 69.   Screening for colon cancer - 06/2022 colonoscopy - large polyp, 3-year recall. History of rectal cancer in 2015. 3 grandparents with history of colon cancer .  Return in 1 year for annual.       [name] DNP, 2:24 PM 03/23/2023

## 2023-03-24 ENCOUNTER — Encounter: Payer: Self-pay | Admitting: Nurse Practitioner

## 2023-03-25 LAB — CYTOLOGY - PAP
Comment: NEGATIVE
Diagnosis: UNDETERMINED — AB
High risk HPV: NEGATIVE

## 2023-04-15 ENCOUNTER — Ambulatory Visit: Payer: 59 | Admitting: Physician Assistant

## 2023-04-15 ENCOUNTER — Encounter: Payer: Self-pay | Admitting: Physician Assistant

## 2023-04-15 DIAGNOSIS — F411 Generalized anxiety disorder: Secondary | ICD-10-CM

## 2023-04-15 DIAGNOSIS — F422 Mixed obsessional thoughts and acts: Secondary | ICD-10-CM

## 2023-04-15 MED ORDER — SERTRALINE HCL 100 MG PO TABS: 300.0000 mg | ORAL_TABLET | Freq: Every day | ORAL | 3 refills | Status: DC

## 2023-04-15 MED ORDER — ALPRAZOLAM 1 MG PO TABS: 0.5000 mg | ORAL_TABLET | Freq: Two times a day (BID) | ORAL | 5 refills | Status: DC | PRN

## 2023-04-15 NOTE — Progress Notes (Signed)
[facility]  Patient ID: Monica Lawson,  MRN: [id number]  PCP: Monica Paris, NP  Date of Evaluation: 04/15/2023 Time spent:20 minutes  Chief Complaint:  Chief Complaint   Anxiety; Depression; Follow-up    HISTORY/CURRENT STATUS: For routine med check.   Had a tough winter, mood always a bit lower then. Getting better now. Patient is able to enjoy things.  Energy and motivation are better. Work is going well.   No extreme sadness, tearfulness, or feelings of hopelessness.  Sleeps well most of the time. ADLs and personal hygiene are normal.   Denies any changes in concentration, making decisions, or remembering things.  Appetite has not changed.  Weight is stable.  Still exercises regularly and eats healthy most of the time. Denies suicidal or homicidal thoughts.  Anxiety is controlled. Needs the Xanax when gets overwhelmed or feels panicky.  Doesn't ruminate about things like she has in the past. No rituals now that affect her life negatively.   Patient denies increased energy with decreased need for sleep, increased talkativeness, racing thoughts, impulsivity or risky behaviors, increased spending, increased libido, grandiosity, increased irritability or anger, paranoia, or hallucinations.  Denies dizziness, syncope, seizures, numbness, tingling, tremor, tics, unsteady gait, slurred speech, confusion.  Denies muscle or joint pain, stiffness, or dystonia.  Individual Medical History/ Review of Systems: Changes? :No    Past medications for mental health diagnoses include: Zoloft, Xanax, trazodone, BuSpar, Lexapro,  Allergies: Blue dyes (parenteral), Corn-containing products, Lactose intolerance (gi), Other, Red dye, Yeast-related products, Ciprofloxacin in d5w, Soy allergy, and Wheat  Current Medications:  Current Outpatient Medications:    Calcium Carbonate-Vitamin D (CALCIUM + D PO), Take 1 tablet by mouth 2 (two) times daily., Disp: , Rfl:    diphenhydrAMINE  (BENADRYL) 25 mg capsule, Take 25 mg by mouth every 6 (six) hours as needed for allergies., Disp: , Rfl:    levothyroxine (SYNTHROID) 75 MCG tablet, Take 75 mcg by mouth daily., Disp: , Rfl:    Lysine 1000 MG TABS, Take 1,000 mg by mouth daily. , Disp: , Rfl:    Multiple Vitamin (MULTIVITAMIN) tablet, Take 1 tablet by mouth 2 (two) times daily., Disp: , Rfl:    Multiple Vitamins-Minerals (ZINC PO), Take 1 tablet by mouth daily., Disp: , Rfl:    spironolactone (ALDACTONE) 25 MG tablet, Take 25 mg by mouth daily., Disp: , Rfl: 2   Turmeric 450 MG CAPS, Take by mouth., Disp: , Rfl:    valACYclovir (VALTREX) 500 MG tablet, Take 1 tablet (500 mg total) by mouth daily as needed (coldsores)., Disp: 90 tablet, Rfl: 4   ALPRAZolam (XANAX) 1 MG tablet, Take 0.5-1 tablets (0.5-1 mg total) by mouth 2 (two) times daily as needed. for anxiety, Disp: 60 tablet, Rfl: 5   sertraline (ZOLOFT) 100 MG tablet, Take 3 tablets (300 mg total) by mouth daily., Disp: 270 tablet, Rfl: 3  Current Facility-Administered Medications:    levonorgestrel (MIRENA) 20 MCG/24HR IUD, , Intrauterine, Once, Fontaine, [name], MD Medication Side Effects: none  Family Medical/ Social History: Changes? No  MENTAL HEALTH EXAM:  There were no vitals taken for this visit.There is no height or weight on file to calculate BMI.  General Appearance: Casual and Well Groomed  Eye Contact:  Good  Speech:  Clear and Coherent and Normal Rate  Volume:  Normal  Mood:  Euthymic  Affect:  Congruent  Thought Process:  Goal Directed and Descriptions of Associations: Intact  Orientation:  Full (Time, Place, and Person)  Thought Content: Logical   Suicidal Thoughts:  No  Homicidal Thoughts:  No  Memory:  WNL  Judgement:  Good  Insight:  Good  Psychomotor Activity:  Normal  Concentration:  Concentration: Good and Attention Span: Good  Recall:  Good  Fund of Knowledge: Good  Language: Good  Assets:  Communication Skills Desire for  Improvement Financial Resources/Insurance Housing Transportation Vocational/Educational  ADL's:  Intact  Cognition: WNL  Prognosis:  Good   DIAGNOSES:    ICD-10-CM   1. Mixed obsessional thoughts and acts  F42.2     2. Generalized anxiety disorder  F41.1       Receiving Psychotherapy: Yes    [name], [facility] prn  RECOMMENDATIONS:  PDMP reviewed. Xanax filled 04/10/2023. I provided 20 minutes of face to face time during this encounter, including time spent before and after the visit in records review, medical decision making, counseling pertinent to today's visit, and charting.   She's doing well overall so no changes needed.   Continue Xanax 1 mg, 1/2-1 bid prn. Continue Zoloft 100 mg, 2 qd.  Continue therapy.  Return in 4 months.   [name], PA-C

## 2023-08-17 ENCOUNTER — Encounter: Payer: Self-pay | Admitting: Physician Assistant

## 2023-08-17 ENCOUNTER — Ambulatory Visit: Payer: 59 | Admitting: Physician Assistant

## 2023-08-17 DIAGNOSIS — F411 Generalized anxiety disorder: Secondary | ICD-10-CM

## 2023-08-17 DIAGNOSIS — F422 Mixed obsessional thoughts and acts: Secondary | ICD-10-CM | POA: Diagnosis not present

## 2023-08-17 NOTE — Progress Notes (Signed)
[facility]  Patient ID: Monica Lawson,  MRN: [id number]  PCP: [name], NP  Date of Evaluation: 08/17/2023 Time spent:20 minutes  Chief Complaint:  Chief Complaint   Anxiety; Depression; Follow-up    HISTORY/CURRENT STATUS: For routine med check.   Doing really well. Feels like she's out of her slump she was in. Going to the gym regularly and also continues aireals for exercise and enjoyment.  Work is going okay, stressful but.  They may have someone new joining the practice.  Not sure though.  Patient is able to enjoy things.  Energy and motivation are good.  No extreme sadness, tearfulness, or feelings of hopelessness.  Sleeps well most of the time. ADLs and personal hygiene are normal.   Denies any changes in concentration, making decisions, or remembering things.  Appetite has not changed.  Weight is stable.  No anxiety to speak of.  Denies suicidal or homicidal thoughts.  Patient denies increased energy with decreased need for sleep, increased talkativeness, racing thoughts, impulsivity or risky behaviors, increased spending, increased libido, grandiosity, increased irritability or anger, paranoia, or hallucinations.  Denies dizziness, syncope, seizures, numbness, tingling, tremor, tics, unsteady gait, slurred speech, confusion.  Denies muscle or joint pain, stiffness, or dystonia.  Individual Medical History/ Review of Systems: Changes? :No    Past medications for mental health diagnoses include: Zoloft, Xanax, trazodone, BuSpar, Lexapro,  Allergies: Blue dyes (parenteral), Corn-containing products, Lactose intolerance (gi), Other, Red dye #40 (allura red), Yeast-related products, Ciprofloxacin in d5w, Soy allergy, and Wheat  Current Medications:  Current Outpatient Medications:    ALPRAZolam (XANAX) 1 MG tablet, Take 0.5-1 tablets (0.5-1 mg total) by mouth 2 (two) times daily as needed. for anxiety, Disp: 60 tablet, Rfl: 5   Calcium Carbonate-Vitamin D  (CALCIUM + D PO), Take 1 tablet by mouth 2 (two) times daily., Disp: , Rfl:    diphenhydrAMINE (BENADRYL) 25 mg capsule, Take 25 mg by mouth every 6 (six) hours as needed for allergies., Disp: , Rfl:    levothyroxine (SYNTHROID) 75 MCG tablet, Take 75 mcg by mouth daily., Disp: , Rfl:    Lysine 1000 MG TABS, Take 1,000 mg by mouth daily. , Disp: , Rfl:    Multiple Vitamin (MULTIVITAMIN) tablet, Take 1 tablet by mouth 2 (two) times daily., Disp: , Rfl:    Multiple Vitamins-Minerals (ZINC PO), Take 1 tablet by mouth daily., Disp: , Rfl:    sertraline (ZOLOFT) 100 MG tablet, Take 3 tablets (300 mg total) by mouth daily., Disp: 270 tablet, Rfl: 3   spironolactone (ALDACTONE) 25 MG tablet, Take 25 mg by mouth daily., Disp: , Rfl: 2   Turmeric 450 MG CAPS, Take by mouth., Disp: , Rfl:    valACYclovir (VALTREX) 500 MG tablet, Take 1 tablet (500 mg total) by mouth daily as needed (coldsores)., Disp: 90 tablet, Rfl: 4  Current Facility-Administered Medications:    levonorgestrel (MIRENA) 20 MCG/24HR IUD, , Intrauterine, Once, Fontaine, [name], MD Medication Side Effects: none  Family Medical/ Social History: Changes? No  MENTAL HEALTH EXAM:  There were no vitals taken for this visit.There is no height or weight on file to calculate BMI.  General Appearance: Casual and Well Groomed  Eye Contact:  Good  Speech:  Clear and Coherent and Normal Rate  Volume:  Normal  Mood:  Euthymic  Affect:  Congruent  Thought Process:  Goal Directed and Descriptions of Associations: Intact  Orientation:  Full (Time, Place, and Person)  Thought Content: Logical  Suicidal Thoughts:  No  Homicidal Thoughts:  No  Memory:  WNL  Judgement:  Good  Insight:  Good  Psychomotor Activity:  Normal  Concentration:  Concentration: Good and Attention Span: Good  Recall:  Good  Fund of Knowledge: Good  Language: Good  Assets:  Communication Skills Desire for Improvement Financial  Resources/Insurance Housing Resilience Transportation Vocational/Educational  ADL's:  Intact  Cognition: WNL  Prognosis:  Good   DIAGNOSES:    ICD-10-CM   1. Mixed obsessional thoughts and acts  F42.2     2. Generalized anxiety disorder  F41.1      Receiving Psychotherapy: Yes    [name], [facility] prn  RECOMMENDATIONS:  PDMP reviewed. Xanax filled 08/11/2023. I provided 20 minutes of face to face time during this encounter, including time spent before and after the visit in records review, medical decision making, counseling pertinent to today's visit, and charting.   I am glad to see her doing well.  No change in treatment needed.  Continue Xanax 1 mg, 1/2-1 bid prn. Continue Zoloft 100 mg, 2 qd.  Continue therapy.  Return in 4 months.   [name], PA-C

## 2023-11-11 ENCOUNTER — Other Ambulatory Visit: Payer: Self-pay | Admitting: Physician Assistant

## 2023-12-02 ENCOUNTER — Encounter: Payer: Self-pay | Admitting: Physician Assistant

## 2023-12-02 ENCOUNTER — Ambulatory Visit: Payer: 59 | Admitting: Physician Assistant

## 2023-12-02 DIAGNOSIS — F422 Mixed obsessional thoughts and acts: Secondary | ICD-10-CM | POA: Diagnosis not present

## 2023-12-02 DIAGNOSIS — F411 Generalized anxiety disorder: Secondary | ICD-10-CM

## 2023-12-02 NOTE — Progress Notes (Unsigned)
[facility]  Patient ID: Monica Lawson,  MRN: [id number]  PCP: [name], NP  Date of Evaluation: 12/02/2023 Time spent:20 minutes  Chief Complaint:  Chief Complaint   Anxiety; Depression; Follow-up    HISTORY/CURRENT STATUS: For routine med check.   Has been      Patient denies increased energy with decreased need for sleep, increased talkativeness, racing thoughts, impulsivity or risky behaviors, increased spending, increased libido, grandiosity, increased irritability or anger, paranoia, or hallucinations.  Denies dizziness, syncope, seizures, numbness, tingling, tremor, tics, unsteady gait, slurred speech, confusion.  Denies muscle or joint pain, stiffness, or dystonia.  Individual Medical History/ Review of Systems: Changes? :No    Past medications for mental health diagnoses include: Zoloft, Xanax, trazodone, BuSpar, Lexapro,  Allergies: Blue dyes (parenteral), Corn-containing products, Lactose intolerance (gi), Other, Red dye #40 (allura red), Yeast-derived drug products, Ciprofloxacin in d5w, Soy allergy, and Wheat  Current Medications:  Current Outpatient Medications:    ALPRAZolam (XANAX) 1 MG tablet, TAKE 0.5-1 TABLETS (0.5-1 MG TOTAL) BY MOUTH 2 (TWO) TIMES DAILY AS NEEDED FOR ANXIETY, Disp: 60 tablet, Rfl: 5   Calcium Carbonate-Vitamin D (CALCIUM + D PO), Take 1 tablet by mouth 2 (two) times daily., Disp: , Rfl:    diphenhydrAMINE (BENADRYL) 25 mg capsule, Take 25 mg by mouth every 6 (six) hours as needed for allergies., Disp: , Rfl:    levothyroxine (SYNTHROID) 75 MCG tablet, Take 75 mcg by mouth daily., Disp: , Rfl:    Lysine 1000 MG TABS, Take 1,000 mg by mouth daily. , Disp: , Rfl:    Multiple Vitamin (MULTIVITAMIN) tablet, Take 1 tablet by mouth 2 (two) times daily., Disp: , Rfl:    Multiple Vitamins-Minerals (ZINC PO), Take 1 tablet by mouth daily., Disp: , Rfl:    sertraline (ZOLOFT) 100 MG tablet, Take 3 tablets (300 mg total) by mouth  daily., Disp: 270 tablet, Rfl: 3   spironolactone (ALDACTONE) 25 MG tablet, Take 25 mg by mouth daily., Disp: , Rfl: 2   Turmeric 450 MG CAPS, Take by mouth., Disp: , Rfl:    valACYclovir (VALTREX) 500 MG tablet, Take 1 tablet (500 mg total) by mouth daily as needed (coldsores)., Disp: 90 tablet, Rfl: 4  Current Facility-Administered Medications:    levonorgestrel (MIRENA) 20 MCG/24HR IUD, , Intrauterine, Once, Fontaine, [name], MD Medication Side Effects: none  Family Medical/ Social History: Changes? No  MENTAL HEALTH EXAM:  There were no vitals taken for this visit.There is no height or weight on file to calculate BMI.  General Appearance: Casual and Well Groomed  Eye Contact:  Good  Speech:  Clear and Coherent and Normal Rate  Volume:  Normal  Mood:  Euthymic  Affect:  Congruent  Thought Process:  Goal Directed and Descriptions of Associations: Intact  Orientation:  Full (Time, Place, and Person)  Thought Content: Logical   Suicidal Thoughts:  No  Homicidal Thoughts:  No  Memory:  WNL  Judgement:  Good  Insight:  Good  Psychomotor Activity:  Normal  Concentration:  Concentration: Good and Attention Span: Good  Recall:  Good  Fund of Knowledge: Good  Language: Good  Assets:  Communication Skills Desire for Improvement Financial Resources/Insurance Housing Resilience Transportation Vocational/Educational  ADL's:  Intact  Cognition: WNL  Prognosis:  Good   DIAGNOSES:  No diagnosis found.  Receiving Psychotherapy: Yes    [name], [facility] prn  RECOMMENDATIONS:  PDMP reviewed. Xanax filled 11/11/2023.       Continue Xanax  1 mg, 1/2-1 bid prn. Continue Zoloft 100 mg, 2 qd.  Continue therapy.  Return in 2 months.   [name], PA-C

## 2024-02-02 ENCOUNTER — Ambulatory Visit: Payer: 59 | Admitting: Physician Assistant

## 2024-02-02 ENCOUNTER — Encounter: Payer: Self-pay | Admitting: Physician Assistant

## 2024-02-02 DIAGNOSIS — F411 Generalized anxiety disorder: Secondary | ICD-10-CM

## 2024-02-02 DIAGNOSIS — F422 Mixed obsessional thoughts and acts: Secondary | ICD-10-CM

## 2024-02-02 NOTE — Progress Notes (Signed)
 [facility]  Patient ID: Monica Lawson,  MRN: [id number]  PCP: Leron Millman, NP  Date of Evaluation: 02/02/2024 Time spent:20 minutes  Chief Complaint:  Chief Complaint   Anxiety; Depression; Follow-up    HISTORY/CURRENT STATUS: For routine med check.   A lot of stress at work. Her boss is treating her poorly, they've worked together for 15 years. She's looking for a new job. Needing to take more Xanax . It's effective. Not having PA but is really overwhelmed.  He is now saying that she will have to go to [place]  for training when that has never been proposed before.  Feels like he is doing that to get a rise out of her.  She is very fearful of flying.  Patient is able to enjoy things.  Energy and motivation are good.   No extreme sadness, tearfulness, or feelings of hopelessness.  Sleeps well most of the time. ADLs and personal hygiene are normal.   Denies any changes in concentration, making decisions, or remembering things.  Appetite has not changed.  Weight is stable.   Denies suicidal or homicidal thoughts.  Patient denies increased energy with decreased need for sleep, increased talkativeness, racing thoughts, impulsivity or risky behaviors, increased spending, increased libido, grandiosity, increased irritability or anger, paranoia, or hallucinations.  Denies dizziness, syncope, seizures, numbness, tingling, tremor, tics, unsteady gait, slurred speech, confusion.  Denies muscle or joint pain, stiffness, or dystonia.  Individual Medical History/ Review of Systems: Changes? :No    Past medications for mental health diagnoses include: Zoloft , Xanax , trazodone, BuSpar, Lexapro,  Allergies: Blue dyes (parenteral), Corn-containing products, Lactose intolerance (gi), Other, Red dye #40 (allura red), Yeast-derived drug products, Ciprofloxacin  in d5w, Soy allergy (do not select), and Wheat  Current Medications:  Current Outpatient Medications:    ALPRAZolam  (XANAX ) 1 MG  tablet, TAKE 0.5-1 TABLETS (0.5-1 MG TOTAL) BY MOUTH 2 (TWO) TIMES DAILY AS NEEDED FOR ANXIETY, Disp: 60 tablet, Rfl: 5   Calcium Carbonate-Vitamin D (CALCIUM + D PO), Take 1 tablet by mouth 2 (two) times daily., Disp: , Rfl:    diphenhydrAMINE  (BENADRYL ) 25 mg capsule, Take 25 mg by mouth every 6 (six) hours as needed for allergies., Disp: , Rfl:    levothyroxine (SYNTHROID) 75 MCG tablet, Take 75 mcg by mouth daily., Disp: , Rfl:    Lysine 1000 MG TABS, Take 1,000 mg by mouth daily. , Disp: , Rfl:    Multiple Vitamin (MULTIVITAMIN) tablet, Take 1 tablet by mouth 2 (two) times daily., Disp: , Rfl:    Multiple Vitamins-Minerals (ZINC PO), Take 1 tablet by mouth daily., Disp: , Rfl:    sertraline  (ZOLOFT ) 100 MG tablet, Take 3 tablets (300 mg total) by mouth daily., Disp: 270 tablet, Rfl: 3   spironolactone (ALDACTONE) 25 MG tablet, Take 25 mg by mouth daily., Disp: , Rfl: 2   Turmeric 450 MG CAPS, Take by mouth., Disp: , Rfl:    valACYclovir  (VALTREX ) 500 MG tablet, Take 1 tablet (500 mg total) by mouth daily as needed (coldsores)., Disp: 90 tablet, Rfl: 4  Current Facility-Administered Medications:    levonorgestrel  (MIRENA ) 20 MCG/24HR IUD, , Intrauterine, Once, Fontaine, Evalene SHAUNNA, MD Medication Side Effects: none  Family Medical/ Social History: Changes? No  MENTAL HEALTH EXAM:  There were no vitals taken for this visit.There is no height or weight on file to calculate BMI.  General Appearance: Casual and Well Groomed  Eye Contact:  Good  Speech:  Clear and Coherent and Normal Rate  Volume:  Normal  Mood:  Anxious  Affect:  Congruent  Thought Process:  Goal Directed and Descriptions of Associations: Intact  Orientation:  Full (Time, Place, and Person)  Thought Content: Logical   Suicidal Thoughts:  No  Homicidal Thoughts:  No  Memory:  WNL  Judgement:  Good  Insight:  Good  Psychomotor Activity:  Normal  Concentration:  Concentration: Good and Attention Span: Good  Recall:   Good  Fund of Knowledge: Good  Language: Good  Assets:  Communication Skills Desire for Improvement Financial Resources/Insurance Housing Physical Health Resilience Transportation Vocational/Educational  ADL's:  Intact  Cognition: WNL  Prognosis:  Good   DIAGNOSES:    ICD-10-CM   1. Mixed obsessional thoughts and acts  F42.2     2. Generalized anxiety disorder  F41.1       Receiving Psychotherapy: Yes    Carson Sarvis, [facility] prn  RECOMMENDATIONS:  PDMP reviewed. Xanax  filled 01/21/2024. I provided 20 minutes of face to face time during this encounter, including time spent before and after the visit in records review, medical decision making, counseling pertinent to today's visit, and charting.   We agree that what's going on is circumstantial and once she's out of that job, she'll feel better.   If needed, I'll write a letter stating she's unable to fly due to severe anxiety.  Continue Xanax  1 mg, 1/2-1 bid prn. Continue Zoloft  100 mg, 3 qd.  Continue therapy.  Return in 2 months.   Verneita Cooks, PA-C

## 2024-02-21 ENCOUNTER — Other Ambulatory Visit: Payer: Self-pay | Admitting: Physician Assistant

## 2024-04-01 ENCOUNTER — Encounter: Payer: Self-pay | Admitting: Physician Assistant

## 2024-04-01 ENCOUNTER — Ambulatory Visit: Payer: 59 | Admitting: Physician Assistant

## 2024-04-01 DIAGNOSIS — F411 Generalized anxiety disorder: Secondary | ICD-10-CM

## 2024-04-01 DIAGNOSIS — F422 Mixed obsessional thoughts and acts: Secondary | ICD-10-CM | POA: Diagnosis not present

## 2024-04-01 NOTE — Progress Notes (Signed)
 [facility]  Patient ID: Monica Lawson,  MRN: [id number]  PCP: [name], NP  Date of Evaluation: 04/01/2024 Time spent:20 minutes  Chief Complaint:  Chief Complaint   Anxiety; Depression; Follow-up    HISTORY/CURRENT STATUS: For routine med check.   Work is still a Network engineer. But she's dealing with it the best she can. "Stay tuned." She had more anxiety for awhile and needed the Xanax more often. But she's been able to cut back on that. Is sleeping well.   Patient is able to enjoy things.  Energy and motivation are good.  No extreme sadness, tearfulness, or feelings of hopelessness.  ADLs and personal hygiene are normal.   Denies any changes in concentration, making decisions, or remembering things.  Appetite has not changed.  Weight is stable.   Denies suicidal or homicidal thoughts.  Patient denies increased energy with decreased need for sleep, increased talkativeness, racing thoughts, impulsivity or risky behaviors, increased spending, increased libido, grandiosity, increased irritability or anger, paranoia, or hallucinations.  Denies dizziness, syncope, seizures, numbness, tingling, tremor, tics, unsteady gait, slurred speech, confusion.  Denies muscle or joint pain, stiffness, or dystonia.  Individual Medical History/ Review of Systems: Changes? :No    Past medications for mental health diagnoses include: Zoloft, Xanax, trazodone, BuSpar, Lexapro,  Allergies: Blue dyes (parenteral), Corn-containing products, Lactose intolerance (gi), Other, Red dye #40 (allura red), Yeast-derived drug products, Ciprofloxacin in d5w, Soy allergy (obsolete), and Wheat  Current Medications:  Current Outpatient Medications:    ALPRAZolam (XANAX) 1 MG tablet, TAKE 0.5-1 TABLETS (0.5-1 MG TOTAL) BY MOUTH 2 (TWO) TIMES DAILY AS NEEDED FOR ANXIETY, Disp: 60 tablet, Rfl: 5   Calcium Carbonate-Vitamin D (CALCIUM + D PO), Take 1 tablet by mouth 2 (two) times daily., Disp: , Rfl:     diphenhydrAMINE (BENADRYL) 25 mg capsule, Take 25 mg by mouth every 6 (six) hours as needed for allergies., Disp: , Rfl:    levothyroxine (SYNTHROID) 75 MCG tablet, Take 75 mcg by mouth daily., Disp: , Rfl:    Lysine 1000 MG TABS, Take 1,000 mg by mouth daily. , Disp: , Rfl:    Multiple Vitamin (MULTIVITAMIN) tablet, Take 1 tablet by mouth 2 (two) times daily., Disp: , Rfl:    Multiple Vitamins-Minerals (ZINC PO), Take 1 tablet by mouth daily., Disp: , Rfl:    sertraline (ZOLOFT) 100 MG tablet, TAKE 3 TABLETS BY MOUTH DAILY, Disp: 90 tablet, Rfl: 1   spironolactone (ALDACTONE) 25 MG tablet, Take 25 mg by mouth daily., Disp: , Rfl: 2   Turmeric 450 MG CAPS, Take by mouth., Disp: , Rfl:    valACYclovir (VALTREX) 500 MG tablet, Take 1 tablet (500 mg total) by mouth daily as needed (coldsores)., Disp: 90 tablet, Rfl: 4  Current Facility-Administered Medications:    levonorgestrel (MIRENA) 20 MCG/24HR IUD, , Intrauterine, Once, Fontaine, [name], MD Medication Side Effects: none  Family Medical/ Social History: Changes? No  MENTAL HEALTH EXAM:  There were no vitals taken for this visit.There is no height or weight on file to calculate BMI.  General Appearance: Casual and Well Groomed  Eye Contact:  Good  Speech:  Clear and Coherent and Normal Rate  Volume:  Normal  Mood:  Euthymic  Affect:  Congruent  Thought Process:  Goal Directed and Descriptions of Associations: Intact  Orientation:  Full (Time, Place, and Person)  Thought Content: Logical   Suicidal Thoughts:  No  Homicidal Thoughts:  No  Memory:  WNL  Judgement:  Good  Insight:  Good  Psychomotor Activity:  Normal  Concentration:  Concentration: Good and Attention Span: Good  Recall:  Good  Fund of Knowledge: Good  Language: Good  Assets:  Communication Skills Desire for Improvement Financial Resources/Insurance Housing Physical Health Resilience Transportation Vocational/Educational  ADL's:  Intact  Cognition: WNL   Prognosis:  Good   DIAGNOSES:    ICD-10-CM   1. Mixed obsessional thoughts and acts  F42.2     2. Generalized anxiety disorder  F41.1       Receiving Psychotherapy: No    [name], [facility] in the past  RECOMMENDATIONS:  PDMP reviewed. Xanax filled 03/21/2024. I provided  20 minutes of face to face time during this encounter, including time spent before and after the visit in records review, medical decision making, counseling pertinent to today's visit, and charting.   Even with all the stress she is doing well so no changes will be made.  Continue Xanax 1 mg, 1/2-1 bid prn. Continue Zoloft 100 mg, 3 qd.  Return in 2 months.   [name], PA-C

## 2024-05-25 ENCOUNTER — Other Ambulatory Visit: Payer: Self-pay | Admitting: Physician Assistant

## 2024-05-30 ENCOUNTER — Other Ambulatory Visit: Payer: Self-pay

## 2024-05-30 ENCOUNTER — Telehealth: Payer: Self-pay | Admitting: Physician Assistant

## 2024-05-30 MED ORDER — ALPRAZOLAM 1 MG PO TABS: 0.5000 mg | ORAL_TABLET | Freq: Two times a day (BID) | ORAL | 0 refills | Status: DC | PRN

## 2024-05-30 NOTE — Telephone Encounter (Signed)
 Pt lvm that she needs a refill on her xanax . [facility] [address] Next appt 06/08/24

## 2024-05-30 NOTE — Telephone Encounter (Signed)
 Pended [facility]anax  to [facility] [address].

## 2024-06-08 ENCOUNTER — Ambulatory Visit: Admitting: Physician Assistant

## 2024-06-27 ENCOUNTER — Other Ambulatory Visit: Payer: Self-pay | Admitting: Physician Assistant

## 2024-07-15 ENCOUNTER — Ambulatory Visit: Admitting: Physician Assistant

## 2024-07-15 ENCOUNTER — Encounter: Payer: Self-pay | Admitting: Physician Assistant

## 2024-07-15 DIAGNOSIS — F411 Generalized anxiety disorder: Secondary | ICD-10-CM | POA: Diagnosis not present

## 2024-07-15 DIAGNOSIS — F422 Mixed obsessional thoughts and acts: Secondary | ICD-10-CM | POA: Diagnosis not present

## 2024-07-15 MED ORDER — ALPRAZOLAM 1 MG PO TABS: 0.5000 mg | ORAL_TABLET | Freq: Two times a day (BID) | ORAL | 5 refills | Status: DC | PRN

## 2024-07-15 MED ORDER — SERTRALINE HCL 100 MG PO TABS: 300.0000 mg | ORAL_TABLET | Freq: Every day | ORAL | 3 refills | Status: AC

## 2024-07-15 NOTE — Progress Notes (Signed)
 [facility]  Patient ID: Monica Lawson,  MRN: [id number]  PCP: Leron Millman, NP  Date of Evaluation: 07/15/2024 Time spent:20 minutes  Chief Complaint:  Chief Complaint   Anxiety; Depression; Follow-up    HISTORY/CURRENT STATUS: For routine med check.   Still very stressful at work. Might change jobs if/when the opportunity arises.   If it wasn't for work, she'd be doing well. Family relationships are good. Patient is able to enjoy things.  Energy and motivation are good.  Still works out routinely.  Likes aerials and working on certification for that.  No extreme sadness, tearfulness, or feelings of hopelessness.  Sleeps well most of the time. ADLs and personal hygiene are normal.   Denies any changes in concentration, making decisions, or remembering things.  Appetite has not changed.  Weight is stable. No mania, psychosis, or delirium.  Anxiety is controlled. Still gets overwhelmed easily.  Xanax  helps when needed. Denies suicidal or homicidal thoughts.  Denies dizziness, syncope, seizures, numbness, tingling, tremor, tics, unsteady gait, slurred speech, confusion. Denies muscle or joint pain, stiffness, or dystonia.  Individual Medical History/ Review of Systems: Changes? :No    Past medications for mental health diagnoses include: Zoloft , Xanax , trazodone, BuSpar, Lexapro,  Allergies: Blue dyes (parenteral), Corn-containing products, Lactose intolerance (gi), Other, Red dye #40 (allura red), Yeast-derived drug products, Ciprofloxacin  in d5w, Soy allergy (obsolete), and Wheat  Current Medications:  Current Outpatient Medications:    Calcium Carbonate-Vitamin D (CALCIUM + D PO), Take 1 tablet by mouth 2 (two) times daily., Disp: , Rfl:    diphenhydrAMINE  (BENADRYL ) 25 mg capsule, Take 25 mg by mouth every 6 (six) hours as needed for allergies., Disp: , Rfl:    levothyroxine (SYNTHROID) 75 MCG tablet, Take 75 mcg by mouth daily., Disp: , Rfl:    Lysine 1000 MG TABS,  Take 1,000 mg by mouth daily. , Disp: , Rfl:    Multiple Vitamin (MULTIVITAMIN) tablet, Take 1 tablet by mouth 2 (two) times daily., Disp: , Rfl:    Multiple Vitamins-Minerals (ZINC PO), Take 1 tablet by mouth daily., Disp: , Rfl:    spironolactone (ALDACTONE) 25 MG tablet, Take 25 mg by mouth daily., Disp: , Rfl: 2   Turmeric 450 MG CAPS, Take by mouth., Disp: , Rfl:    valACYclovir  (VALTREX ) 500 MG tablet, Take 1 tablet (500 mg total) by mouth daily as needed (coldsores)., Disp: 90 tablet, Rfl: 4   ALPRAZolam  (XANAX ) 1 MG tablet, Take 0.5-1 tablets (0.5-1 mg total) by mouth 2 (two) times daily as needed. for anxiety, Disp: 60 tablet, Rfl: 5   sertraline  (ZOLOFT ) 100 MG tablet, Take 3 tablets (300 mg total) by mouth daily., Disp: 270 tablet, Rfl: 3  Current Facility-Administered Medications:    levonorgestrel  (MIRENA ) 20 MCG/24HR IUD, , Intrauterine, Once, Fontaine, Evalene SHAUNNA, MD Medication Side Effects: none  Family Medical/ Social History: Changes? No  MENTAL HEALTH EXAM:  There were no vitals taken for this visit.There is no height or weight on file to calculate BMI.  General Appearance: Casual and Well Groomed  Eye Contact:  Good  Speech:  Clear and Coherent and Normal Rate  Volume:  Normal  Mood:  Euthymic  Affect:  Congruent  Thought Process:  Goal Directed and Descriptions of Associations: Intact  Orientation:  Full (Time, Place, and Person)  Thought Content: Logical   Suicidal Thoughts:  No  Homicidal Thoughts:  No  Memory:  WNL  Judgement:  Good  Insight:  Good  Psychomotor  Activity:  Normal  Concentration:  Concentration: Good and Attention Span: Good  Recall:  Good  Fund of Knowledge: Good  Language: Good  Assets:  Communication Skills Desire for Improvement Financial Resources/Insurance Housing Physical Health Resilience Social Support Transportation Vocational/Educational  ADL's:  Intact  Cognition: WNL  Prognosis:  Good   DIAGNOSES:    ICD-10-CM   1.  Mixed obsessional thoughts and acts  F42.2     2. Generalized anxiety disorder  F41.1       Receiving Psychotherapy: No    Johnetta Bers, [facility] in the past  RECOMMENDATIONS:  PDMP reviewed. Xanax  filled 06/28/2024. I provided approximately  20  minutes of face to face time during this encounter, including time spent before and after the visit in records review, medical decision making, counseling pertinent to today's visit, and charting.   She practices healthy lifestyle choices, cont to do so.  As far as her meds are concerned, she's doing well so no changes will be made.   Continue Xanax  1 mg, 1/2-1 bid prn. Continue Zoloft  100 mg, 3 qd.  Continue vitamins and supplements  as per med list.  Return in 6-8 weeks.   Verneita Cooks, PA-C

## 2024-08-26 ENCOUNTER — Ambulatory Visit: Admitting: Physician Assistant

## 2024-08-31 ENCOUNTER — Telehealth: Payer: Self-pay | Admitting: Physician Assistant

## 2024-08-31 NOTE — Telephone Encounter (Signed)
 [facility] year [facility] RF [facility] [address]. LVM per DPR with this information.

## 2024-08-31 NOTE — Telephone Encounter (Signed)
 Next appt is 09/15/24. Requesting refill on Sertraline  300 mg called to:  [id number] [place], [place] - [address]   Phone: [phone]  Fax: [phone]

## 2024-09-15 ENCOUNTER — Encounter: Payer: Self-pay | Admitting: Physician Assistant

## 2024-09-15 ENCOUNTER — Ambulatory Visit (INDEPENDENT_AMBULATORY_CARE_PROVIDER_SITE_OTHER): Admitting: Physician Assistant

## 2024-09-15 DIAGNOSIS — F411 Generalized anxiety disorder: Secondary | ICD-10-CM | POA: Diagnosis not present

## 2024-09-15 DIAGNOSIS — F422 Mixed obsessional thoughts and acts: Secondary | ICD-10-CM | POA: Diagnosis not present
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
## 2024-10-24 ENCOUNTER — Ambulatory Visit (INDEPENDENT_AMBULATORY_CARE_PROVIDER_SITE_OTHER): Admitting: Physician Assistant

## 2024-10-24 ENCOUNTER — Encounter: Payer: Self-pay | Admitting: Physician Assistant

## 2024-10-24 DIAGNOSIS — F4323 Adjustment disorder with mixed anxiety and depressed mood: Secondary | ICD-10-CM

## 2024-10-24 DIAGNOSIS — F422 Mixed obsessional thoughts and acts: Secondary | ICD-10-CM
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
## 2024-12-05 ENCOUNTER — Ambulatory Visit (INDEPENDENT_AMBULATORY_CARE_PROVIDER_SITE_OTHER): Admitting: Physician Assistant

## 2024-12-05 ENCOUNTER — Encounter: Payer: Self-pay | Admitting: Physician Assistant

## 2024-12-05 DIAGNOSIS — F422 Mixed obsessional thoughts and acts: Secondary | ICD-10-CM

## 2024-12-05 DIAGNOSIS — F4323 Adjustment disorder with mixed anxiety and depressed mood: Secondary | ICD-10-CM

## 2024-12-05 NOTE — Progress Notes (Signed)
 [facility]  Patient ID: Monica Lawson,  MRN: [id number]  PCP: Leron Millman, NP  Date of Evaluation: 12/05/2024 Time spent:20 minutes  Chief Complaint:  Chief Complaint   Anxiety; Depression; Follow-up    HISTORY/CURRENT STATUS: For routine med check.   She's doing really well. Hasn't started looking for a job yet. Decided to have a break until after the new year.  She feels so much better since she left that job.   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  Energy and motivation are good.  No extreme sadness, tearfulness, or feelings of hopelessness.  Sleeps well.  ADLs and personal hygiene are normal.   Denies any changes in concentration, making decisions, or remembering things.  Appetite has not changed.  Weight is stable.  Anxiety is controlled.  Doesn't obsess like she has in the past.  Sometimes still washes her hands more than she needs to.  Xanax  helps when needed.  No mania, delirium, AH/VH.  No SI/HI.  Individual Medical History/ Review of Systems: Changes? :No    Past medications for mental health diagnoses include: Zoloft , Xanax , trazodone, BuSpar, Lexapro,  Allergies: Blue dyes (parenteral), Corn-containing products, Lactose intolerance (gi), Other, Red dye #40 (allura red), Yeast-derived drug products, Ciprofloxacin  in d5w, Soy allergy (obsolete), and Wheat  Current Medications:  Current Outpatient Medications:    ALPRAZolam  (XANAX ) 1 MG tablet, Take 0.5-1 tablets (0.5-1 mg total) by mouth 2 (two) times daily as needed. for anxiety, Disp: 60 tablet, Rfl: 5   Calcium Carbonate-Vitamin D (CALCIUM + D PO), Take 1 tablet by mouth 2 (two) times daily., Disp: , Rfl:    diphenhydrAMINE  (BENADRYL ) 25 mg capsule, Take 25 mg by mouth every 6 (six) hours as needed for allergies., Disp: , Rfl:    levothyroxine (SYNTHROID) 75 MCG tablet, Take 75 mcg by mouth daily., Disp: , Rfl:    Lysine  1000 MG TABS, Take 1,000 mg by mouth daily. , Disp: , Rfl:    Multiple Vitamin (MULTIVITAMIN) tablet, Take 1 tablet by mouth 2 (two) times daily., Disp: , Rfl:    Multiple Vitamins-Minerals (ZINC PO), Take 1 tablet by mouth daily., Disp: , Rfl:    sertraline  (ZOLOFT ) 100 MG tablet, Take 3 tablets (300 mg total) by mouth daily., Disp: 270 tablet, Rfl: 3   spironolactone (ALDACTONE) 25 MG tablet, Take 25 mg by mouth daily., Disp: , Rfl: 2   Turmeric 450 MG CAPS, Take by mouth., Disp: , Rfl:    valACYclovir  (VALTREX ) 500 MG tablet, Take 1 tablet (500 mg total) by mouth daily as needed (coldsores)., Disp: 90 tablet, Rfl: 4  Current Facility-Administered Medications:    levonorgestrel  (MIRENA ) 20 MCG/24HR IUD, , Intrauterine, Once, Fontaine, Evalene SHAUNNA, MD Medication Side Effects: none  Family Medical/ Social History: Changes?  No  MENTAL HEALTH EXAM:  There were no vitals taken for this visit.There is no height or weight on file to calculate BMI.  General Appearance: Casual and Well Groomed  Eye Contact:  Good  Speech:  Clear and Coherent and Normal Rate  Volume:  Normal  Mood:  Euthymic  Affect:  Congruent  Thought Process:  Goal Directed and Descriptions of Associations: Intact  Orientation:  Full (Time, Place, and Person)  Thought Content: Logical   Suicidal Thoughts:  No  Homicidal Thoughts:  No  Memory:  WNL  Judgement:  Good  Insight:  Good  Psychomotor Activity:  Normal  Concentration:  Concentration: Good and Attention Span: Good  Recall:  Good  Fund of Knowledge: Good  Language: Good  Assets:  Communication Skills Desire for Improvement Financial Resources/Insurance Housing Leisure Time Resilience Social Support Transportation Vocational/Educational  ADL's:  Intact  Cognition: WNL  Prognosis:  Good   DIAGNOSES:    ICD-10-CM   1. Mixed obsessional thoughts and acts  F42.2     2. Situational mixed anxiety and depressive disorder  F43.23      Receiving  Psychotherapy: No    Johnetta Bers, [facility] in the past  RECOMMENDATIONS:  PDMP reviewed. Xanax  filled 11/19/2024. I provided approximately  20 minutes of face to face time during this encounter, including time spent before and after the visit in records review, medical decision making, counseling pertinent to today's visit, and charting.   Continue Xanax  1 mg, 1/2-1 bid prn. Continue Zoloft  100 mg, 3 qd.  Continue vitamins and supplements  as per med list.  Return in 3 months.   Verneita Cooks, PA-C

## 2025-01-18 ENCOUNTER — Other Ambulatory Visit: Payer: Self-pay | Admitting: Physician Assistant

## 2025-03-08 ENCOUNTER — Ambulatory Visit: Admitting: Physician Assistant
